# Patient Record
Sex: Female | Born: 1975 | Race: Black or African American | Hispanic: No | Marital: Married | Smoking: Current every day smoker
Health system: Southern US, Community
[De-identification: ages and names within clinical notes are randomized; demographics above are authoritative.]

---

## 2004-08-30 ENCOUNTER — Observation Stay: Payer: Self-pay

## 2004-09-16 DIAGNOSIS — O4290 Premature rupture of membranes, unspecified as to length of time between rupture and onset of labor, unspecified weeks of gestation: Secondary | ICD-10-CM

## 2006-04-10 ENCOUNTER — Emergency Department: Payer: Self-pay | Admitting: Emergency Medicine

## 2010-04-21 ENCOUNTER — Inpatient Hospital Stay: Payer: Self-pay | Admitting: Internal Medicine

## 2011-05-13 IMAGING — CR DG CHEST 2V
1 series · 3 of 3 positions shown · non-contrast
Comparison: none

REASON FOR EXAM: r side rib pain
COMMENTS:   May transport without cardiac monitor

[Series 1: view not recorded · 0.17mm/px · 3 of 3 slices shown]
[im 1/3]
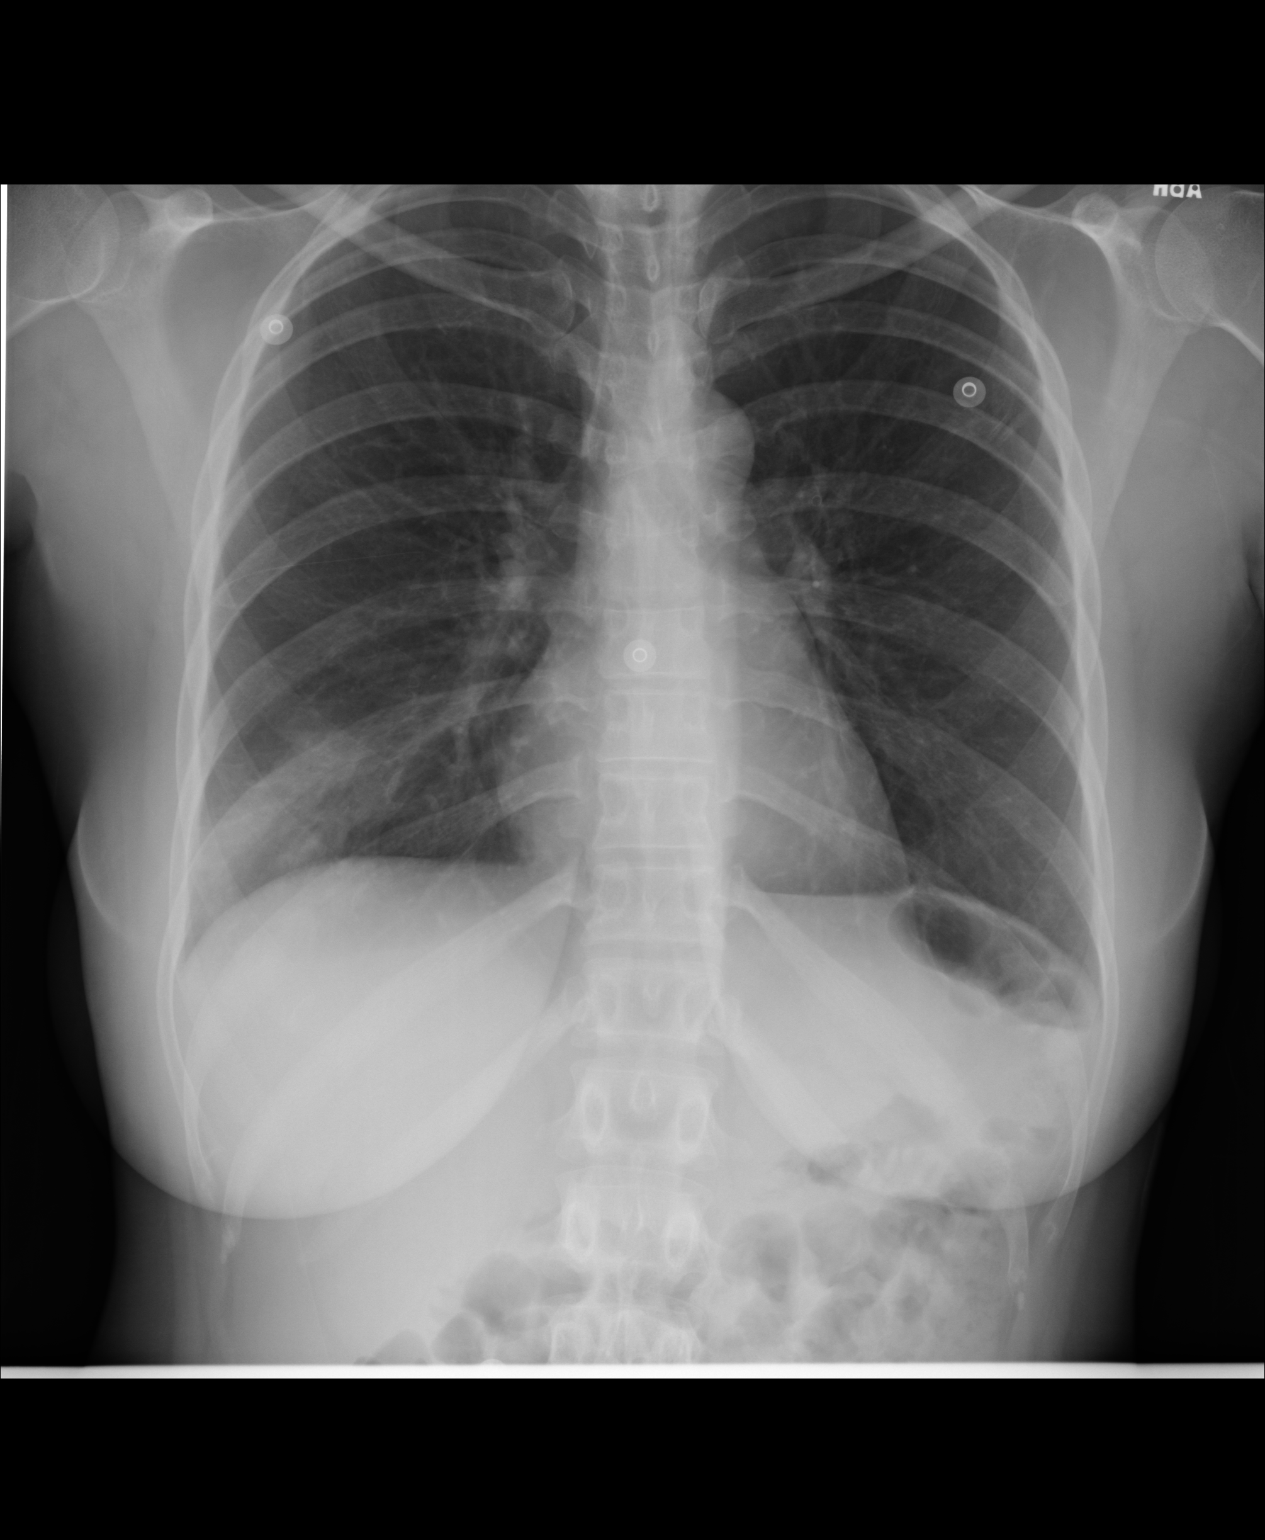
[im 2/3]
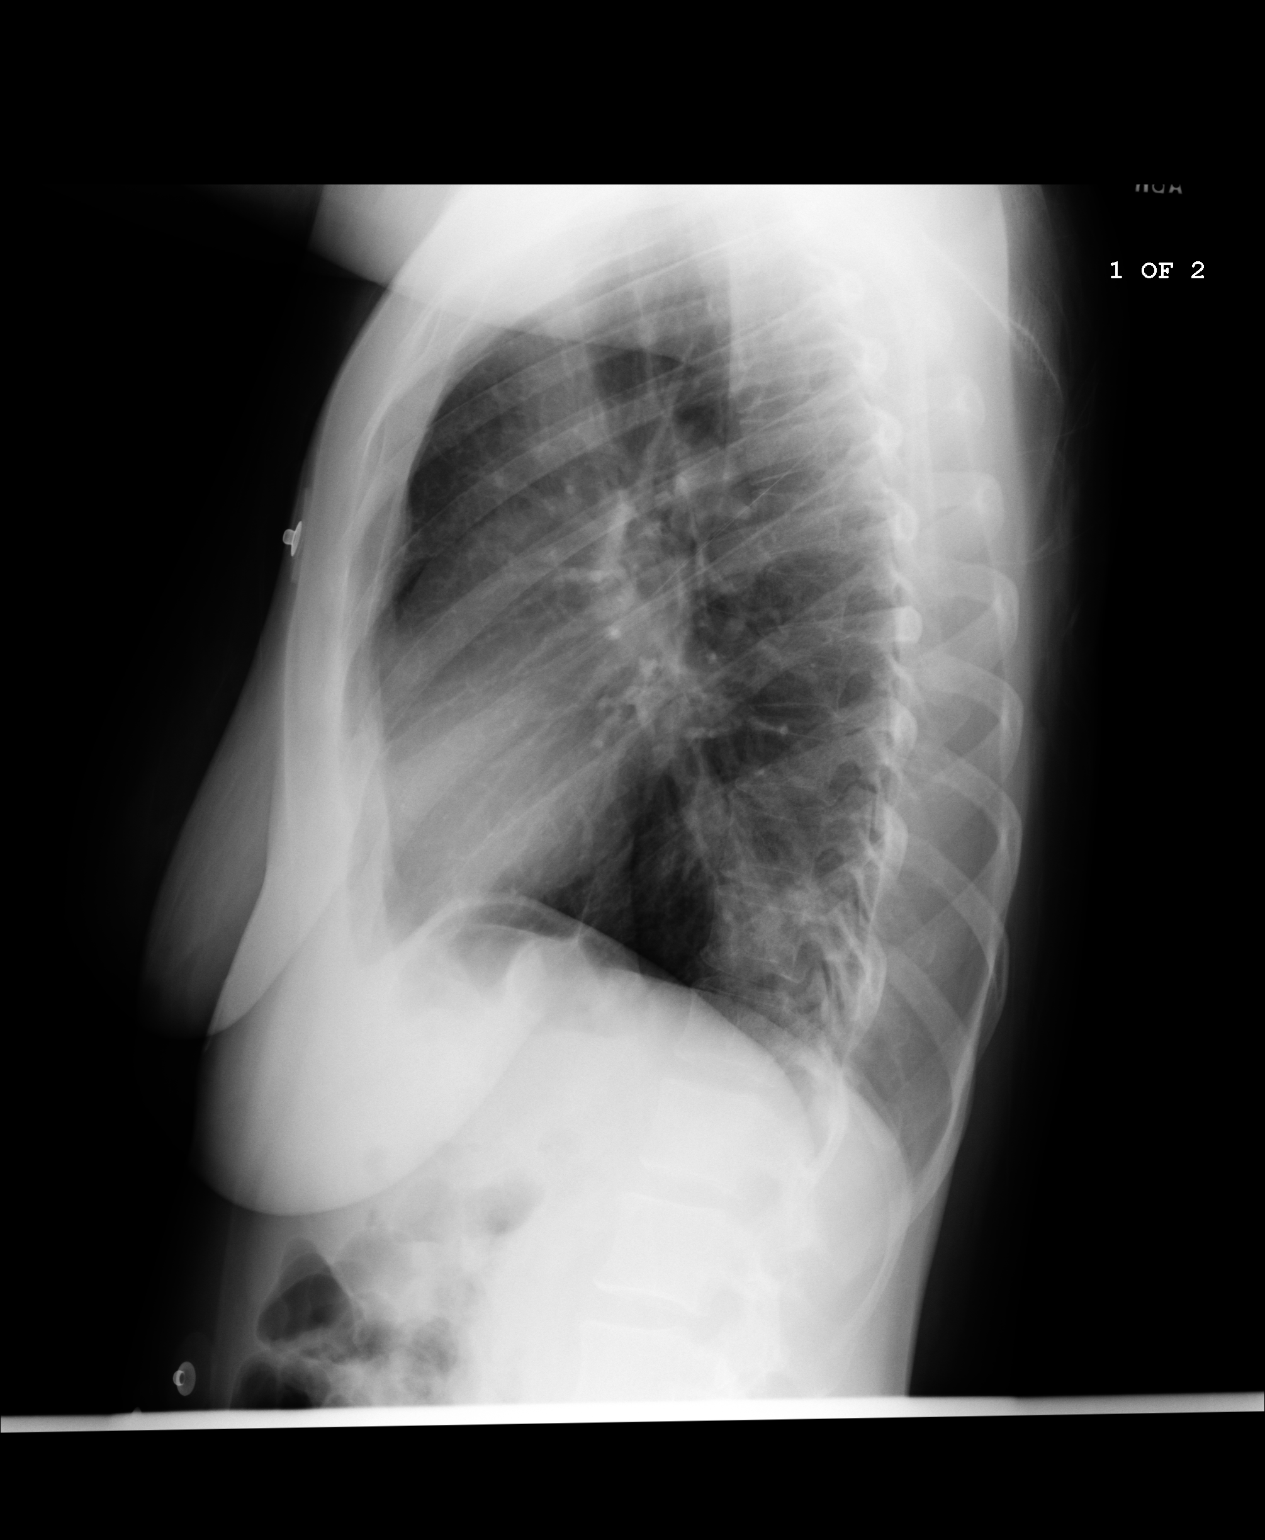
[im 3/3]
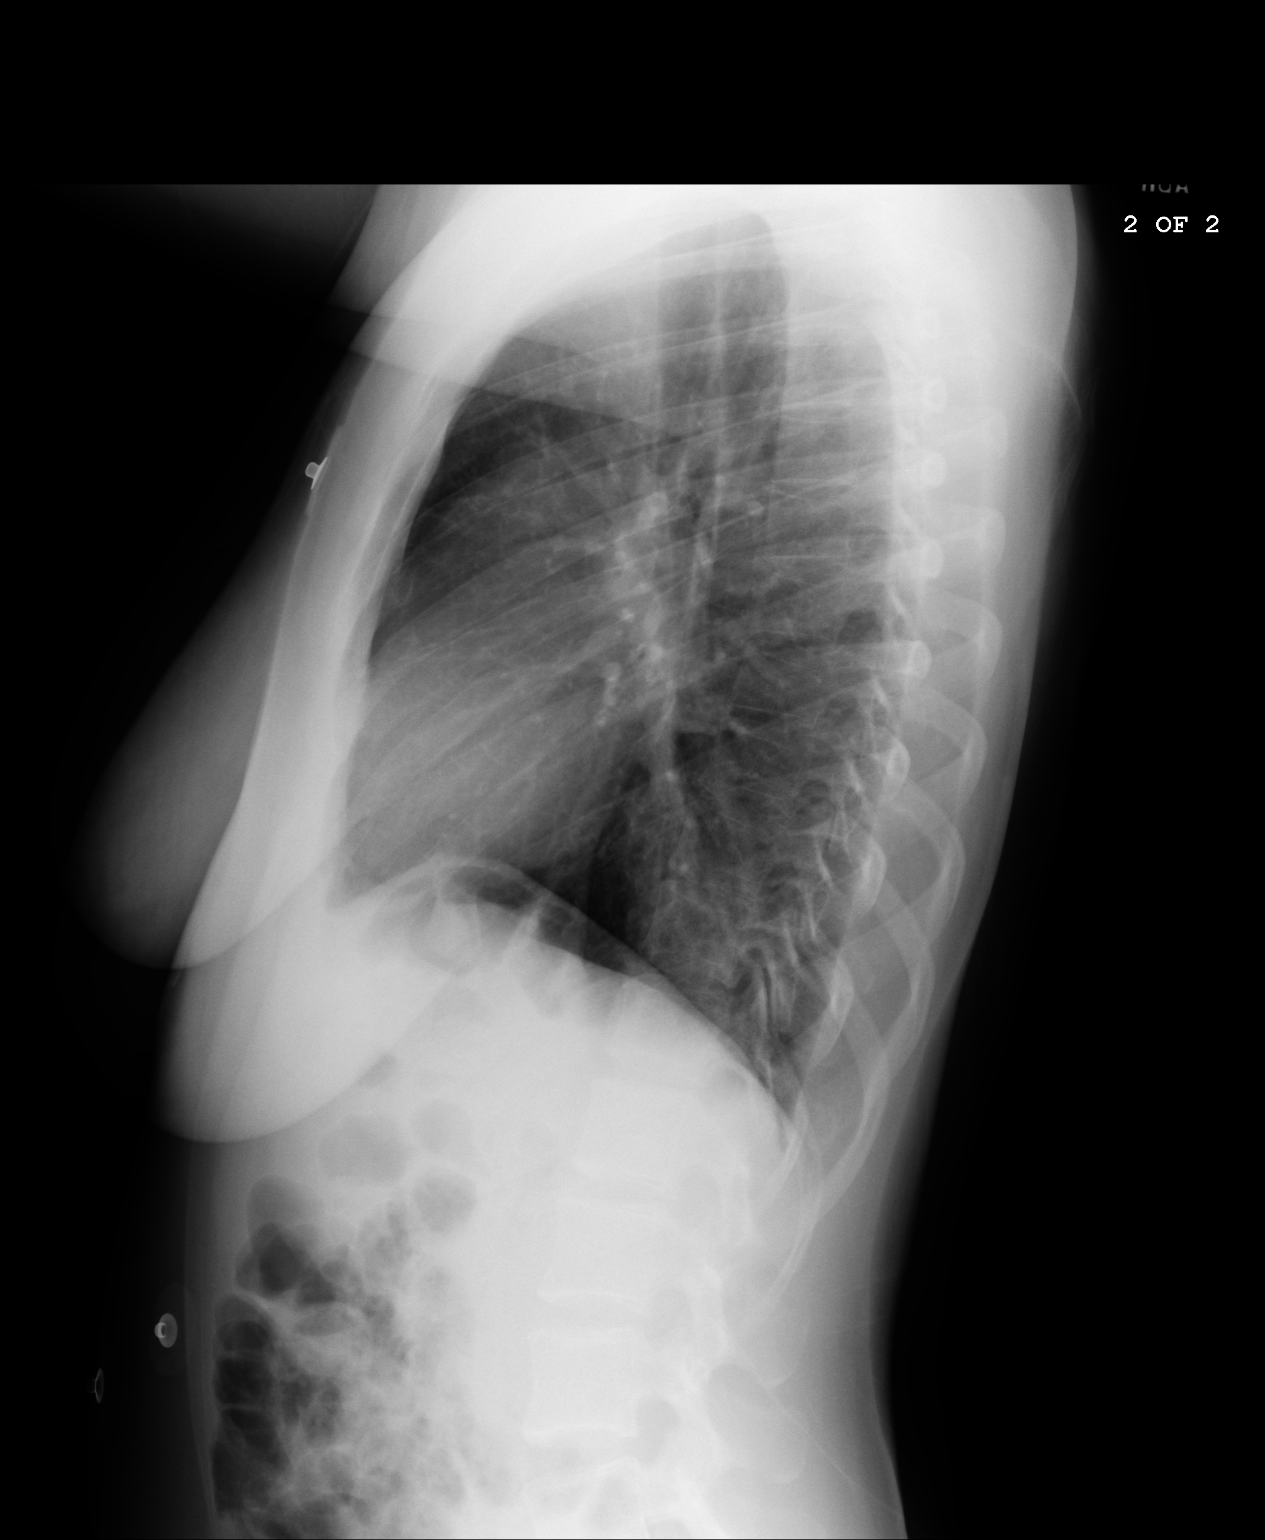

[3 of 3 positions shown; findings below may reference images not displayed]

PROCEDURE:     DXR - DXR CHEST PA (OR AP) AND LATERAL  - April 21, 2010  [DATE]

RESULT:     An area of increased density projects within the posterolateral
segment of the right lower lobe. No further focal regions of consolidation
or focal infiltrates appreciated. The cardiac silhouette and visualized bony
skeleton are unremarkable.
IMPRESSION: Right lower lobe infiltrate. Surveillance evaluation recommended status post
appropriate therapeutic regimen.

## 2011-09-09 ENCOUNTER — Encounter: Payer: Self-pay | Admitting: Obstetrics & Gynecology

## 2011-09-23 ENCOUNTER — Encounter: Payer: Self-pay | Admitting: Maternal and Fetal Medicine

## 2011-10-07 ENCOUNTER — Encounter: Payer: Self-pay | Admitting: Obstetrics and Gynecology

## 2011-10-07 ENCOUNTER — Observation Stay: Payer: Self-pay | Admitting: Obstetrics and Gynecology

## 2011-10-14 ENCOUNTER — Encounter: Payer: Self-pay | Admitting: Obstetrics & Gynecology

## 2011-10-22 ENCOUNTER — Encounter: Payer: Self-pay | Admitting: Pediatric Cardiology

## 2011-10-31 ENCOUNTER — Encounter: Payer: Self-pay | Admitting: Obstetrics & Gynecology

## 2011-11-18 ENCOUNTER — Encounter: Payer: Self-pay | Admitting: Maternal & Fetal Medicine

## 2011-12-05 ENCOUNTER — Encounter: Payer: Self-pay | Admitting: Obstetrics and Gynecology

## 2011-12-19 ENCOUNTER — Inpatient Hospital Stay: Payer: Self-pay | Admitting: Obstetrics and Gynecology

## 2011-12-19 LAB — CBC WITH DIFFERENTIAL/PLATELET
Eosinophil %: 0.6 %
HCT: 26.6 % — ABNORMAL LOW (ref 35.0–47.0)
Lymphocyte #: 1.6 10*3/uL (ref 1.0–3.6)
Lymphocyte %: 25.9 %
MCV: 89 fL (ref 80–100)
Monocyte %: 18.6 %
Neutrophil #: 3.4 10*3/uL (ref 1.4–6.5)
Neutrophil %: 54.8 %
Platelet: 235 10*3/uL (ref 150–440)
RBC: 3.01 10*6/uL — ABNORMAL LOW (ref 3.80–5.20)
RDW: 14.6 % — ABNORMAL HIGH (ref 11.5–14.5)
WBC: 6.3 10*3/uL (ref 3.6–11.0)

## 2011-12-19 LAB — DRUG SCREEN, URINE
Barbiturates, Ur Screen: NEGATIVE (ref ?–200)
Benzodiazepine, Ur Scrn: NEGATIVE (ref ?–200)
Cannabinoid 50 Ng, Ur ~~LOC~~: NEGATIVE (ref ?–50)
Cocaine Metabolite,Ur ~~LOC~~: NEGATIVE (ref ?–300)
MDMA (Ecstasy)Ur Screen: NEGATIVE (ref ?–500)
Phencyclidine (PCP) Ur S: NEGATIVE (ref ?–25)

## 2011-12-22 LAB — CBC WITH DIFFERENTIAL/PLATELET
Basophil %: 0.3 %
Eosinophil #: 0 10*3/uL (ref 0.0–0.7)
Eosinophil %: 0.1 %
HGB: 7.8 g/dL — ABNORMAL LOW (ref 12.0–16.0)
Lymphocyte %: 25.2 %
Neutrophil %: 56 %
RBC: 2.63 10*6/uL — ABNORMAL LOW (ref 3.80–5.20)

## 2011-12-25 LAB — CBC WITH DIFFERENTIAL/PLATELET
Basophil #: 0 10*3/uL (ref 0.0–0.1)
Basophil %: 0 %
Eosinophil #: 0 10*3/uL (ref 0.0–0.7)
HCT: 28.8 % — ABNORMAL LOW (ref 35.0–47.0)
HGB: 9.5 g/dL — ABNORMAL LOW (ref 12.0–16.0)
Lymphocyte %: 13.6 %
MCHC: 32.9 g/dL (ref 32.0–36.0)
Monocyte %: 13.9 %
Neutrophil #: 9.1 10*3/uL — ABNORMAL HIGH (ref 1.4–6.5)
Neutrophil %: 72.2 %
Platelet: 268 10*3/uL (ref 150–440)
RDW: 14.7 % — ABNORMAL HIGH (ref 11.5–14.5)
WBC: 12.6 10*3/uL — ABNORMAL HIGH (ref 3.6–11.0)

## 2011-12-26 DIAGNOSIS — O4290 Premature rupture of membranes, unspecified as to length of time between rupture and onset of labor, unspecified weeks of gestation: Secondary | ICD-10-CM

## 2011-12-26 DIAGNOSIS — Z9889 Other specified postprocedural states: Secondary | ICD-10-CM

## 2011-12-26 LAB — HEMATOCRIT: HCT: 27.4 % — ABNORMAL LOW (ref 35.0–47.0)

## 2011-12-27 LAB — PATHOLOGY REPORT

## 2012-12-09 IMAGING — US US OB FOLLOW-UP - NRPT [facility]
1 series · 14 of 28 positions shown · non-contrast
Comparison: none

[Series 1: us ob follow-up - nrpt [facility] · 14 of 89 slices shown]
[im 4/89]
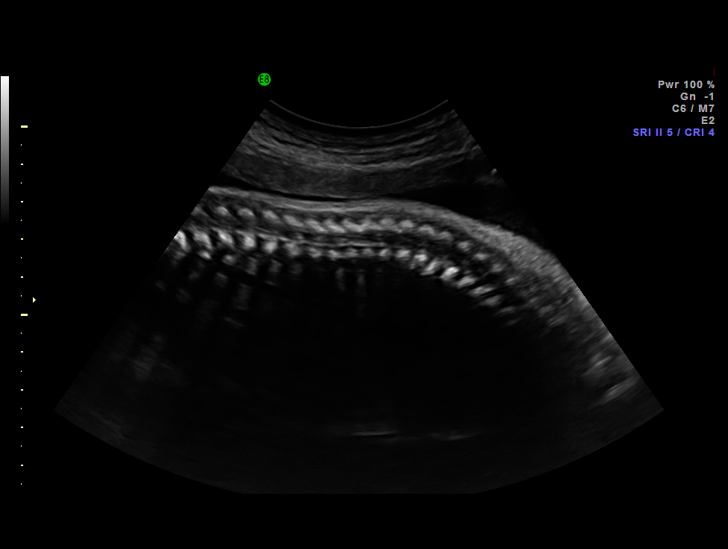
[im 10/89]
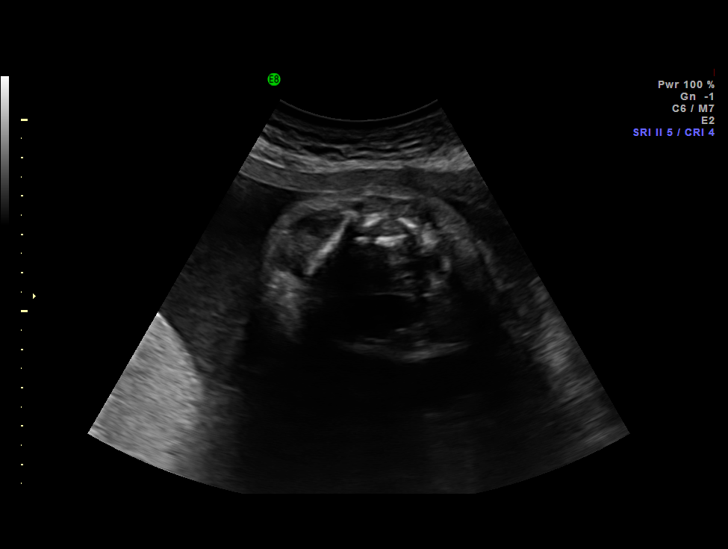
[im 17/89]
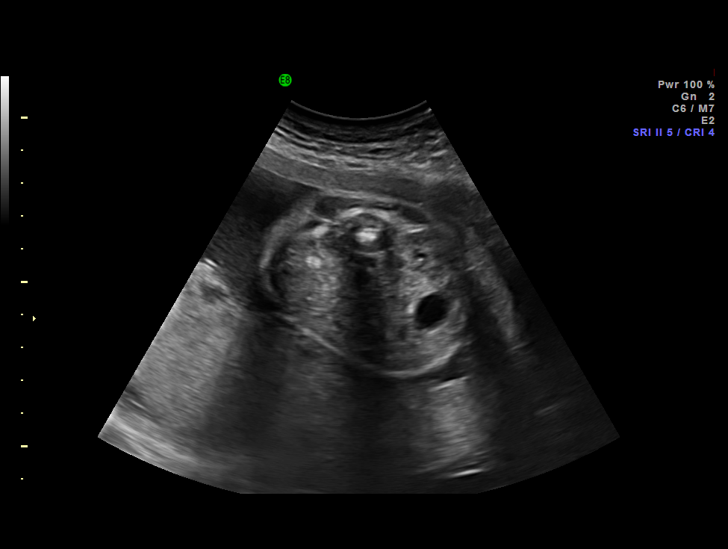
[im 23/89]
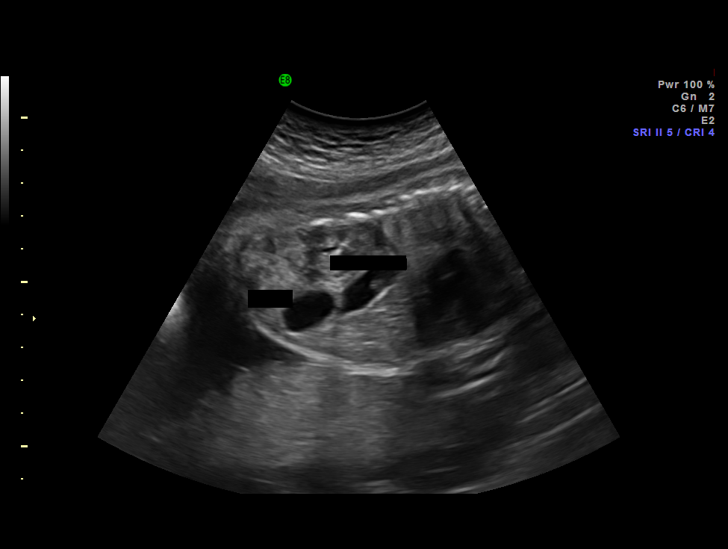
[im 30/89]
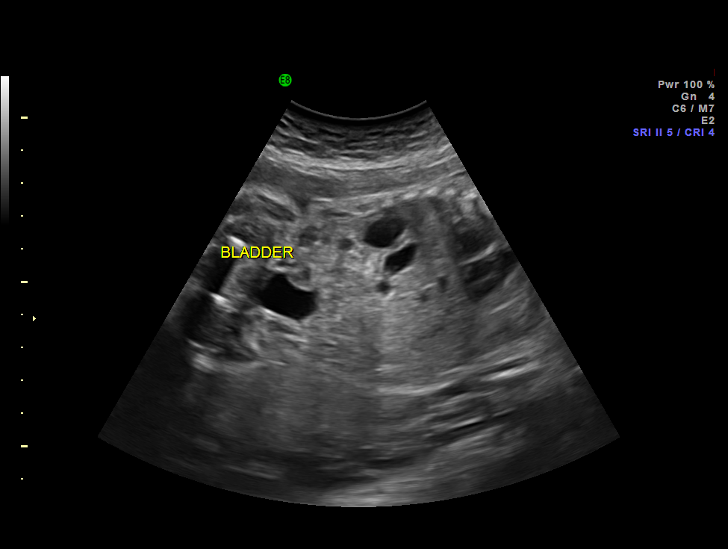
[im 36/89]
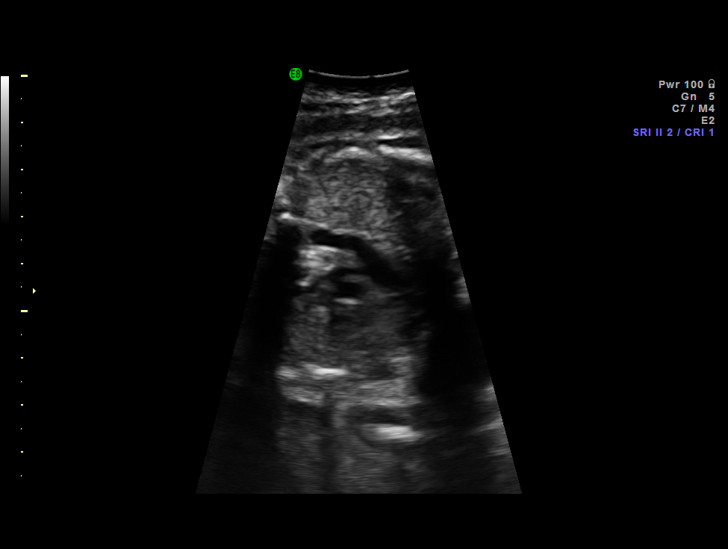
[im 43/89]
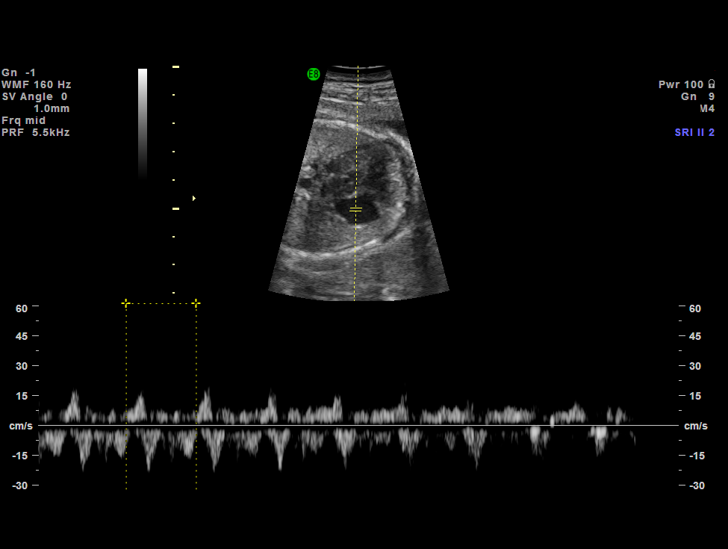
[im 49/89]
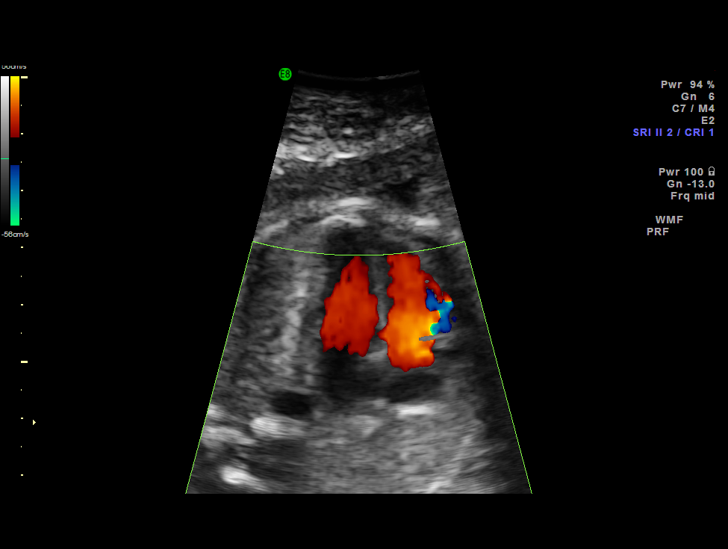
[im 56/89]
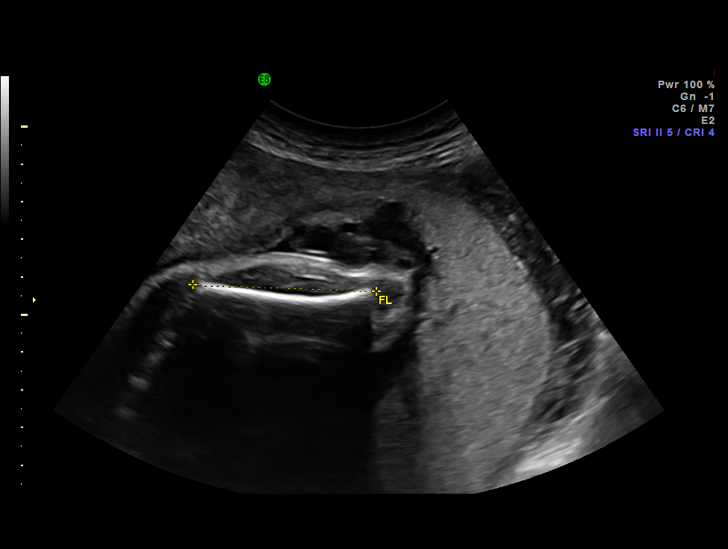
[im 62/89]
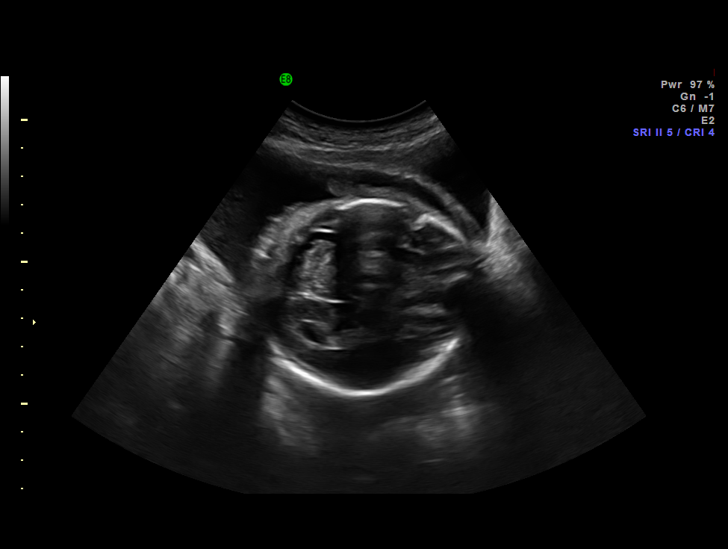
[im 69/89]
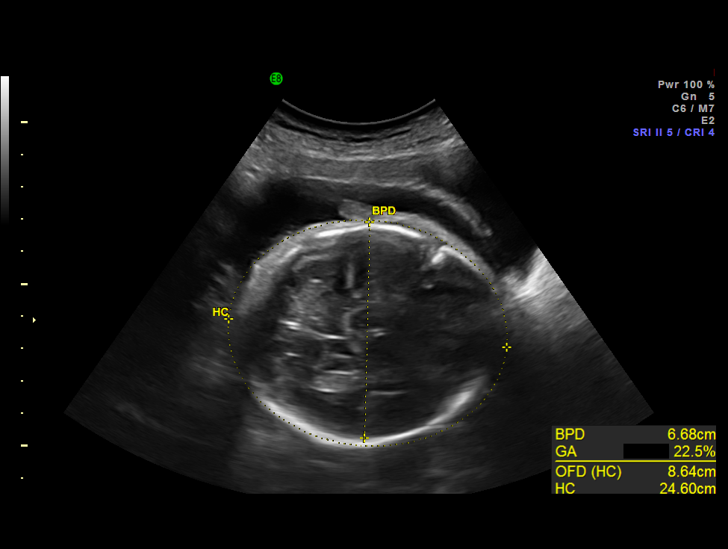
[im 75/89]
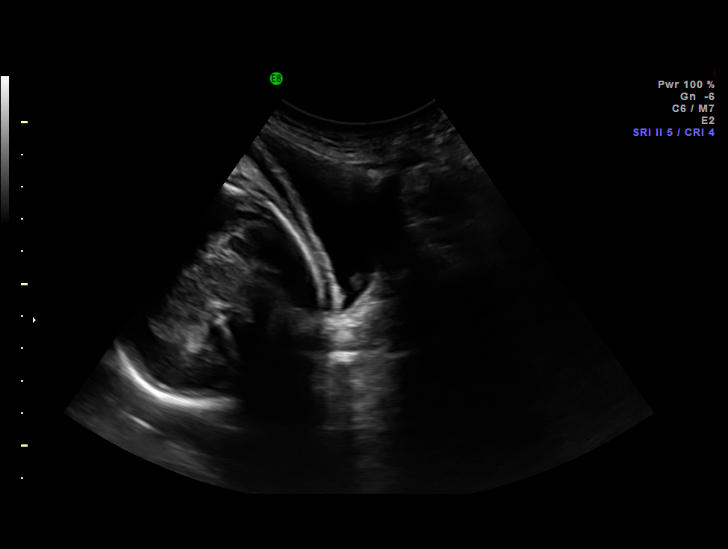
[im 82/89]
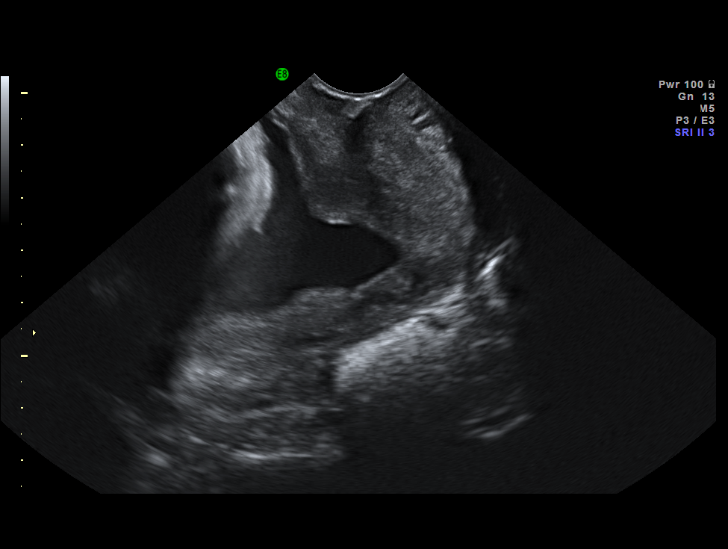
[im 89/89]
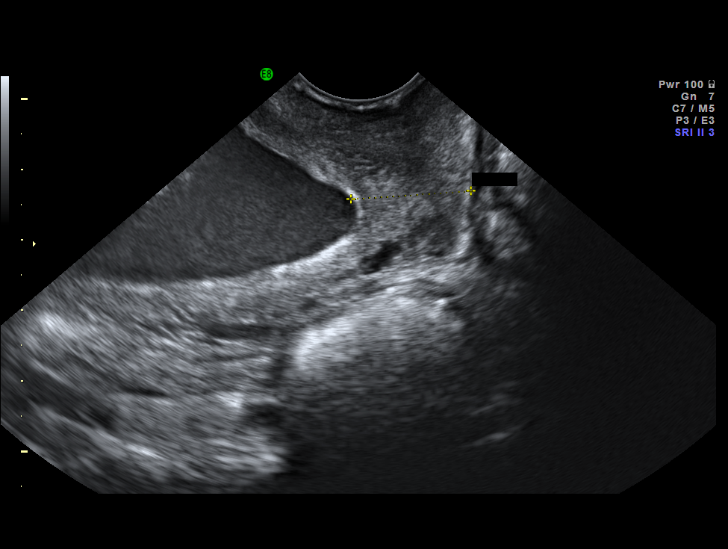

[14 of 28 positions shown; findings below may reference images not displayed]

IMAGES IMPORTED FROM THE [employer] WORKFLOW SYSTEM
NO DICTATION FOR STUDY

## 2013-01-13 IMAGING — US US OB FOLLOW-UP
1 series · 13 of 28 positions shown · non-contrast
Comparison: none

REASON FOR EXAM: 32w 4d, SROM, cerclage, need AFI and growth
COMMENTS:

[Series 1: us ob follow-up · 0.33mm/px · 13 of 94 slices shown]
[im 4/94]
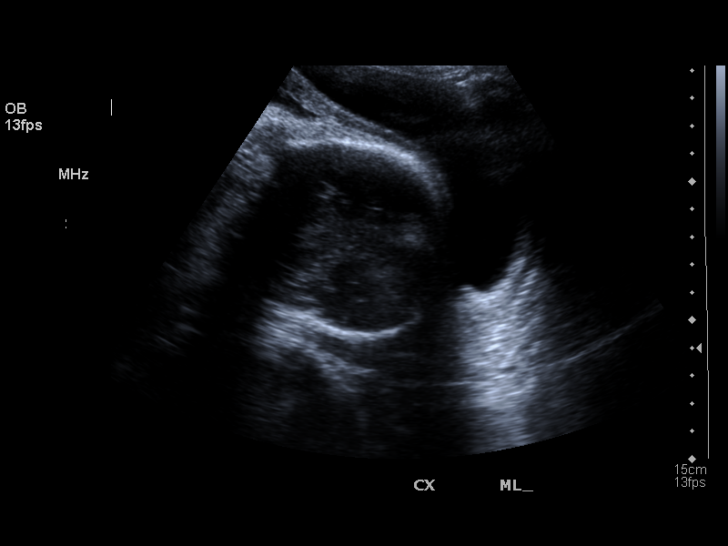
[im 11/94]
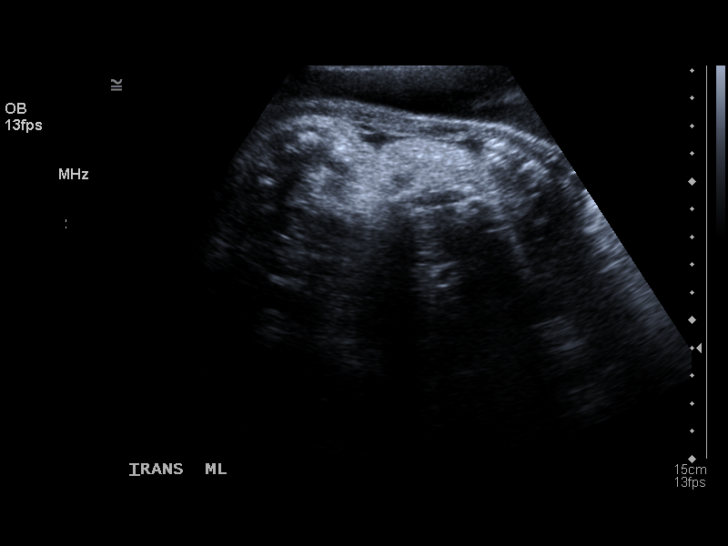
[im 18/94]
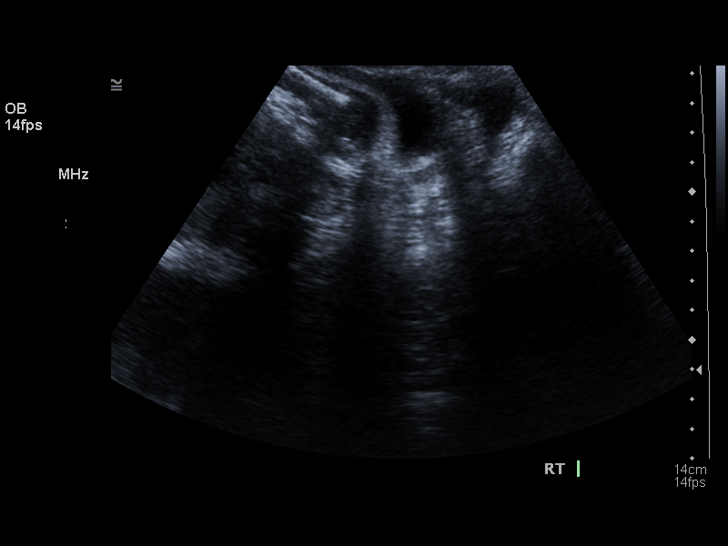
[im 25/94]
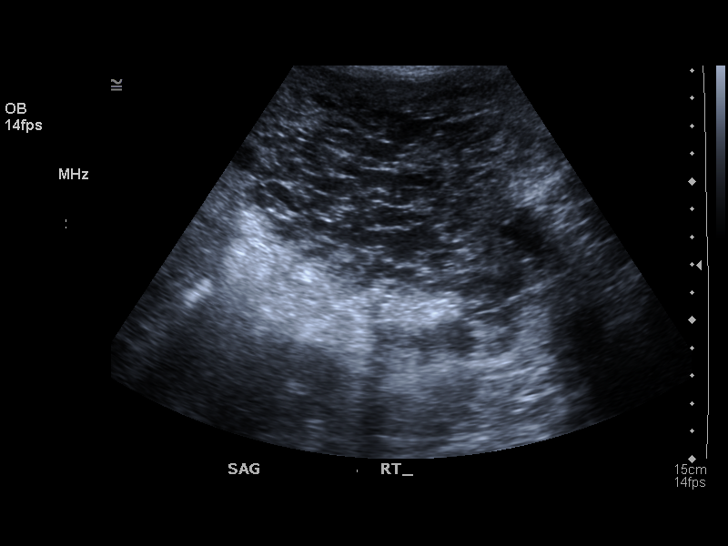
[im 32/94]
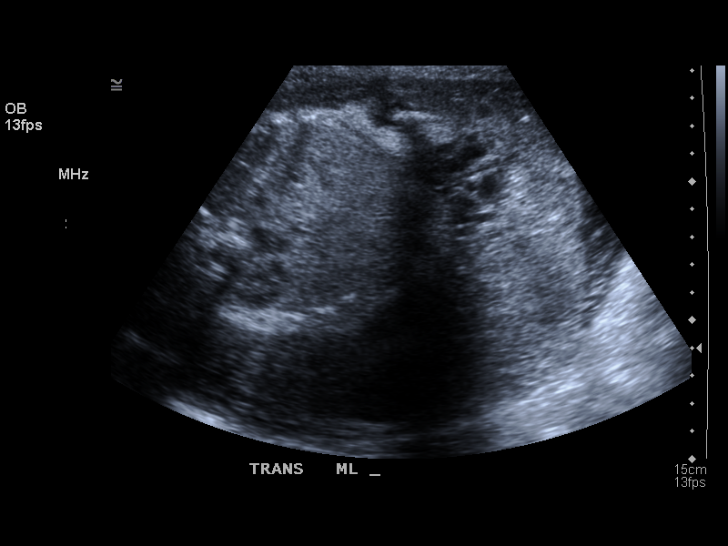
[im 38/94]
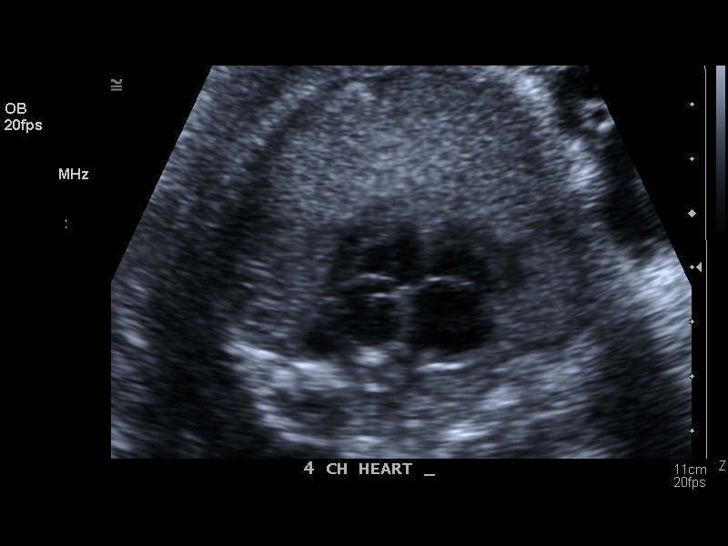
[im 49/94]
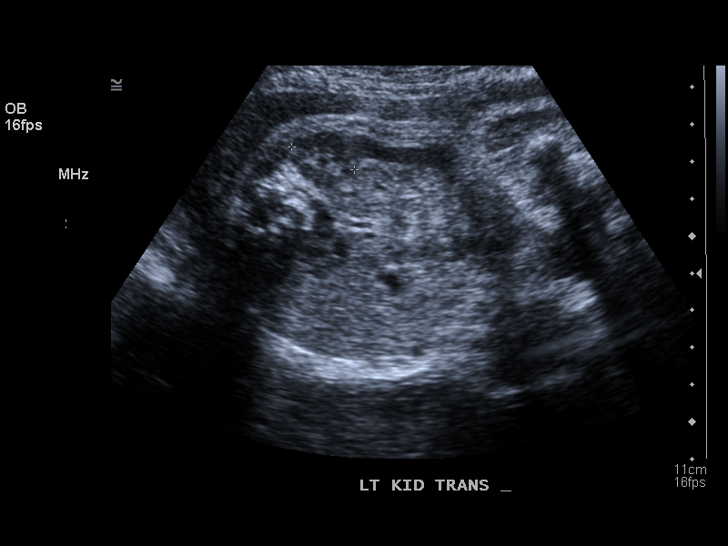
[im 56/94]
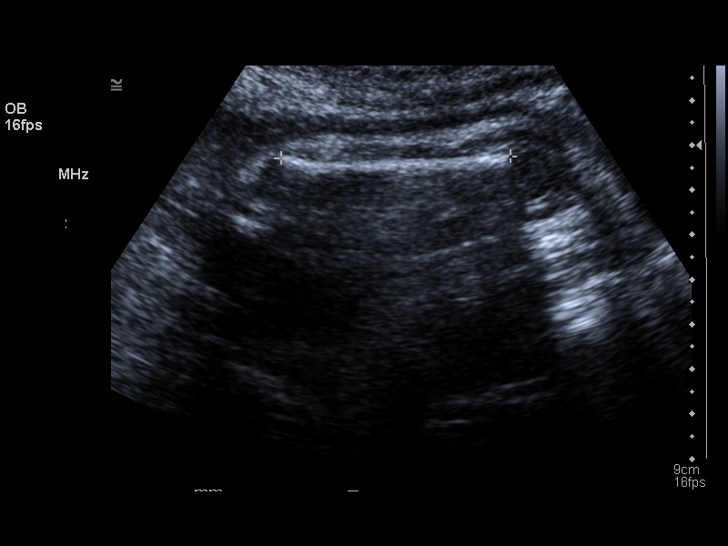
[im 63/94]
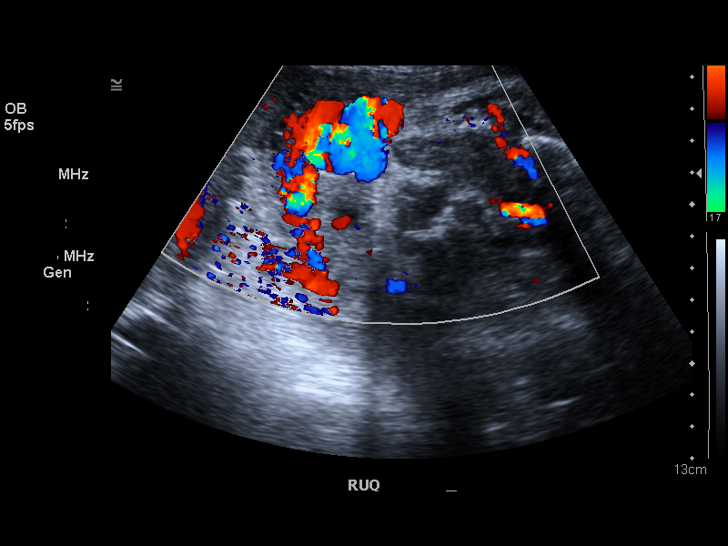
[im 69/94]
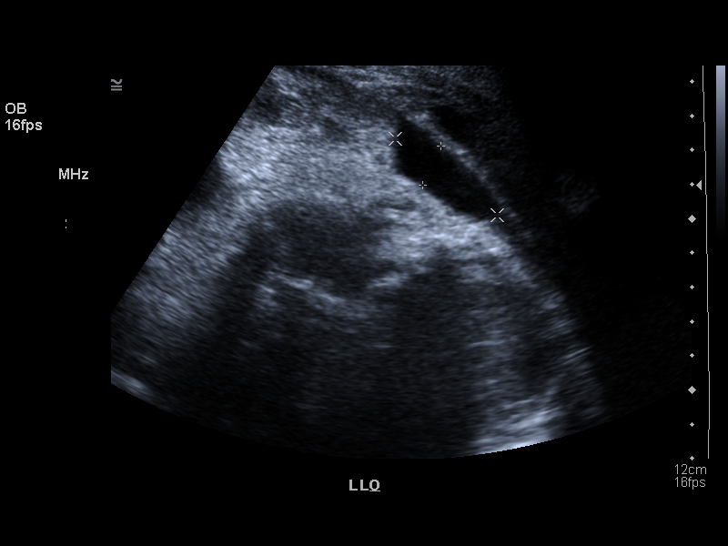
[im 76/94]
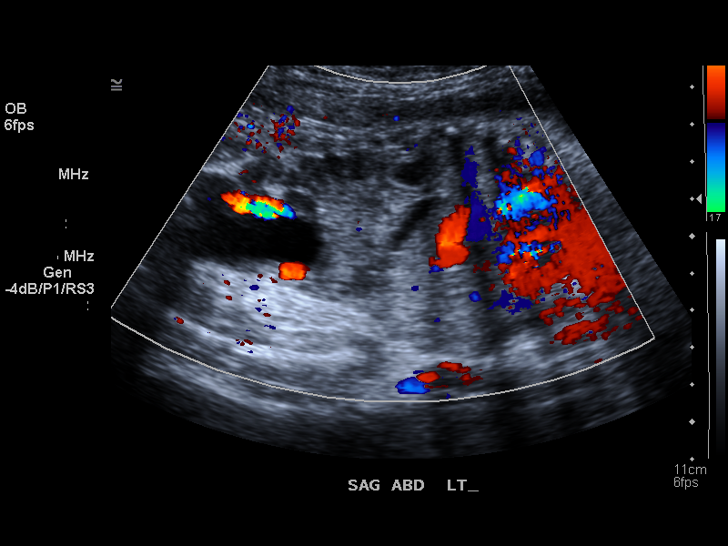
[im 83/94]
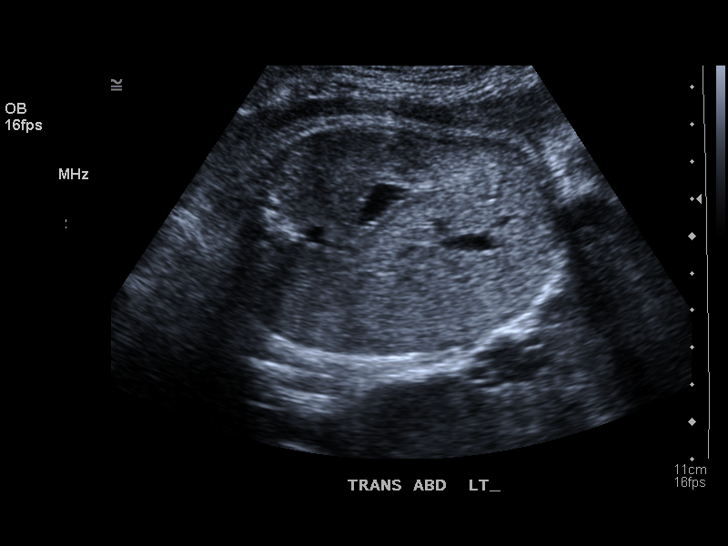
[im 90/94]
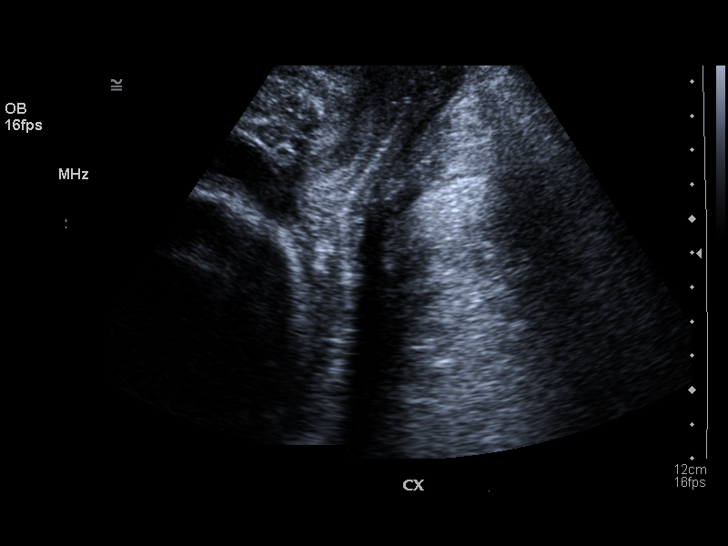

[13 of 28 positions shown; findings below may reference images not displayed]

PROCEDURE:     US  - US OB FOLLOW UP  - December 23, 2011 [DATE]

RESULT:

This patient has been followed in the high risk OB ultrasound clinic.
Limited follow-up examination was performed on this date as requested to
evaluate amnionic fluid level and fetal growth. There is observed a single
living intrauterine gestation. Presentation currently is cephalic. Fetal
heart rate was monitored at 136 beats per minute. The patient reportedly has
a cerclage. No fluid is identified in the cervix on the current exam. The
placenta is posterior in location. The inferior tip of the placenta is
approximately 10.97 cm above the cervix. Amnionic fluid volume appears
decreased. AFI measures 4 cm.

Fetal measurements are as follows:

BPD is 71 mm, corresponding to 28 weeks, 4 days
HC is 276.2 mm, corresponding to 30 weeks, 1 day
AC is 256.9 mm, corresponding to 29 weeks, 6 days
FL is 56.7 mm, corresponding to 29 weeks 5 days
HL is 51.3 mm, corresponding to 30 weeks, 0 days

EFW is 1,449 grams + / - 219 grams. Average ultrasound age based on today's
measurements is 29 weeks, 5 days and ultrasound EDD is 03/04/2012. On the
prior exam at the high risk clinic, gestational age is estimated to be 29
weeks, 6 days. As noted on the current exam, estimated gestational age is 29
weeks, 5 days with there having been apparently no appreciable interval
growth since the previous study. It is recommended that in view of the
current oligohydramnios and in view of the lack of apparent interval growth
since the prior exam of nearly 3 weeks ago, the patient return for
additional evaluation at the high risk OB clinic.
IMPRESSION: Please see above.

## 2015-02-26 NOTE — Consult Note (Signed)
    Maternal Age 39    Gravida 5    Para 3    Term 2    PreTerm 1    Abortion 1    Living 3    EDC 14-Feb-2012    Gestational Age (wks, days) 31.6    Gestation Single    Maternal Blood Type B    Maternal Rh Positive    Maternal HIV Negative    Maternal Syphilis Ab Nonreactive    Maternal Rubella Immune    Maternal HBsAg Unknown    Maternal GBS Unknown    Prenatal Care Adequate, Late    Family/Social History SocHx: EtOH, Tobacco, and THC use during pregnancy.  Urine toxicology screen was negative at time of admission.     Additional Comments Requested to consult by Dr. Jean RosenthalJackson on this 39 year old B+, serology reassuring mother with history of tobacco, alcohol, and THC use during pregnancy, who is now 631 6/[redacted] week gestation by 17 week US with EFW of 11%ile (11/18/11) with prenatal concern for abdominal cyst that has remained stable in size and cerclage placed at 21 weeks for cocnerns of cervical incompency.  Mom has been on 17-OHP, but receiving doses irregularly due to transportation issues.  She presented last evening for concerns of gush of fluid at 00:30.  [facility], she was positive for PPROM and PTL.  She has started betamethasone, and double coverage latency antibiotics.  Ob is currently planning expectant management with possible induction if she makes it to 34/35 weeks.  I spent ~1245minutes reviewing chart, discussing care plan with Dr. Jean RosenthalJackson and nursing, and consulting with mother.  I informed mom of like morbidity/mortality of an infant born at 32 weeks with EFW of 11% who has been exposed to EtOH, tobacco, and THC.  Discussed potential concerns regarding abdominal cyst,a nd how we will evaluate it once her daughter is born.  Mom expressed understanding.  I answered her questions, and she expressed appreciation for the consultation.  As mom was just able to get her power turned back on, her boyfriend (FOB) requires dialysis, she has a 7213 and 37 year old at  home, and is hopefully going to remain on inpateint bedrest for 2-3 weeks, I offered social work consultation.  She declined it at this time.  Thank you for allowing us to particiapte in her care, and please let us know if there are any additional questions or concerns.  We will expect to attend her delivery.   Electronic Signatures: Torrie MayersHorowitz, Thomasena Vandenheuvel N (MD)  (Signed [phone]-Feb-13 13:24)  Authored: PREGNANCY and LABOR, ADDITIONAL COMMENTS   Last Updated: 14-Feb-13 13:24 by Torrie MayersHorowitz, Charly Hunton N (MD)

## 2015-02-26 NOTE — Op Note (Signed)
PATIENT NAME:  Emily Morrow, Emily Morrow MR#:  [id number] DATE OF BIRTH:  28-Sep-1976  DATE OF PROCEDURE:  12/25/2011  PREOPERATIVE DIAGNOSES:  1. Preterm premature rupture of membranes at 31 weeks, 6 days, currently day seven of rupture at a gestational age of [redacted] weeks, 5 days. 2. Fetal abdominal cyst.  3. Intrauterine growth restriction.  4. Retained McDonald cerclage.   POSTOPERATIVE DIAGNOSES: 1. Preterm premature rupture of membranes at 31 weeks, 6 days, currently day seven of rupture at a gestational age of [redacted] weeks, 5 days. 2. Fetal abdominal cyst.  3. Intrauterine growth restriction.  4. Retained McDonald cerclage.   PROCEDURES PERFORMED:  1. Primary low transverse Cesarean section. 2. Bilateral tubal ligation via Pomeroy method.   ANESTHESIA: Spinal epidural.   ANTIBIOTICS: 2 grams of Ancef.   PRIMARY SURGEON: Florina Ou. Bonney Aid, MD    ASSISTANT: Farrel Conners, CNM    ESTIMATED BLOOD LOSS: 600 mL.   DRAINS OR TUBES: Foley to gravity drainage.   IMPLANTS: None.   PROCEDURE FINDINGS: Emily Morrow female infant weighing 1590 grams, 3 pounds, 8 ounces, Apgars 8 and 9. The infant's name is Emily Morrow.    COMPLICATIONS: None.   PATIENT CONDITION FOLLOWING THE PROCEDURE: Stable.   SPECIMENS REMOVED: Bilateral portions of fallopian tube.   PROCEDURE IN DETAIL: Risks, benefits, and alternatives of the procedure were discussed with the patient in detail prior to proceeding to the OR. The patient's McDonald cerclage which consisted of a single stitch of Mersilene tape was removed. However, the cerclage came out in two pieces and the cervix failed to dilate after removal. There was concern for possible retained portion of cerclage which was unable to be removed and given the risk of continued labor in the presence of retained cerclage, decision was made with the patient to proceed with delivery via primary low transverse Cesarean section. The patient had an epidural in place. She was taken to  the operating room where a spinal was administered. She was positioned in the supine position and prepped and draped in the usual sterile fashion. A Pfannenstiel skin incision was made 2 cm above the pubic symphysis and carried down sharply to the level of the rectus fascia using a knife. The rectus fascia was incised and then extended using Mayo scissors. The superior border of the rectus fascia was grasped with two Kocher clamps. The underlying rectus was dissected off bluntly and attention was then turned to the inferior border of the rectus fascia. It was likewise bluntly dissected off the rectus muscles and the median raphe was incised using Mayo scissors. The peritoneum was then entered bluntly and extended using manual traction. A bladder blade was placed. Bladder flap was then made using Metzenbaum scissors and developed digitally before replacing the bladder blade. The uterus was scored with the knife and the lower uterine segment was noted to be quite thin. The hysterotomy incision was extended using the operator's finger. The infant was noted to be in the OP position. The infant's vertex was grasped, flexed, brought to the hysterotomy incision and delivered atraumatically using fundal pressure. Infant was suctioned, cord was clamped and cut, and the infant was passed to the waiting NICU team. Cord blood was obtained. Cord gas was not obtained given that the infant was vigorous and crying at the time of delivery. The placenta was removed using manual extraction. The uterus was exteriorized and wiped clean of clots and debris. The edges of the hysterotomy incision were tagged using Allis clamps. The hysterotomy was then  closed in two layers using 0 Vicryl with the first layer being a running locked stitch, the second layer being a horizontal imbricating layer. Following closure of the hysterotomy incision, it was inspected and noted to be hemostatic.   Attention was then turned to the patient's right  fallopian tube which was grasped with a Babcock clamp. It was doubly suture ligated using a 2-0 chromic on a reel. The knuckle of tube was then excised using Metzenbaum scissors noting a good cross section of tube. Attention was then turned to the patient's left fallopian tube. It was excised in a similar fashion and both fallopian tubes were noted to be hemostatic after excision.   The pelvis was then irrigated using warm saline. The uterus was returned to the abdomen and the gutters were wiped clean of clots and debris. Peritoneum was closed using a running 2-0 Monocryl. The rectus muscles were inspected and noted to be hemostatic. At this time the On-Q system was placed. The trocars were inserted approximately 2 cm above the Pfannenstiel skin incision and inserted through the fascia. Once the trocars were placed, they were removed and the On-Q catheters were threaded through the introducers which were removed. Following this, the rectus fascia was closed using a running #1 loop PDS. The subcutaneous tissue was irrigated and hemostasis achieved using the Bovie. Skin was closed in a running subcuticular fashion using 4-0 Monocryl. Following closure of the skin, benzoin was applied and Steri-Strips were applied across the Pfannenstiel skin incision. The base of the On-Q catheters were glued in placed using Dermabond. The catheters were then secured using Steri-Strips and dressed with tegaderm. Following this, each catheter was primed using 5 mL of 0.5% bupivacaine. The patient tolerated the procedure well. Sponge, needle, and instrument counts were correct x2.   ____________________________ Florina OuAndreas M. Bonney AidStaebler, MD ams:drc D: 12/26/2011 12:37:08 ET T: 12/26/2011 12:48:18 ET JOB#: [id number]  cc: Florina OuAndreas M. Bonney AidStaebler, MD, <Dictator> Carmel SacramentoANDREAS Cathrine MusterM Delitha Elms MD ELECTRONICALLY SIGNED 01/19/2012 14:31

## 2015-03-14 NOTE — H&P (Signed)
L&D Evaluation:  History Expanded:   HPI 39 yo [id number] with EDD of 02/14/12 per 17 week US. [facility], history notable for h/o LEEP, cervix of 5-8 mm and McDonald cerclage placed by [facility] at 21 weeks, weekly 17 P injections for h/o PTD at 32 weeks, frequent ETOH use during pregnancy (pt declined [facility] referral), tobacco and marijuana use, fetal abdominal cyst that was rechecked by [facility] without change in size and anemia. Most recent EFW was 11% on 1/14, cervical length was 1.1-1.3 cm on 1/31. Fetal echo WNL, ordered for family h/o congenital heart disease (report not available).  Pt presents tonight with c/o gush of fluid at 0030, denies regular contractions or vaginal bleeding, good fetal movement.    Blood Type B positive    Group B Strep Results (Result >5wks must be treated as unknown) unknown/result > 5 weeks ago    Maternal HIV Negative    Maternal Syphilis Ab Nonreactive    Maternal Varicella Non-Immune    Rubella Results immune    Maternal T-Dap Nonimmune    Patient's Medical History No Chronic Illness    Patient's Surgical History LEEP  Cerclage    Medications Pre Natal Vitamins    Allergies NKDA    Social History tobacco  EtOH  drugs  marijuana   ROS:   ROS see HPI   Exam:   General no apparent distress    Mental Status clear    Abdomen gravid, non-tender    Estimated Fetal Weight Small for gestational age    Fetal Position vertex    Edema no edema    Pelvic no external lesions, closed per cerclage/80/-1    Mebranes Ruptured    Description clear    FHT initially minimal variability, category I after IV bolus    Ucx irregular    Skin dry    Other Nitrizine, pooling and Fern: positive   Impression:   Impression PPROM   Plan:   Comments Dr Patton SallesWeaver-Lee made aware of admission Betamethasone & Ampicillin Monitor for s/sx of labor or other complications.  UDS & Care Management consult   Electronic  Signatures: Vella KohlerBrothers, Johsua Shevlin K (CNM)  (Signed 14-Feb-13 03:28)  Authored: L&D Evaluation   Last Updated: 14-Feb-13 03:28 by Vella KohlerBrothers, Venia Riveron K (CNM)

## 2018-01-09 ENCOUNTER — Emergency Department: Payer: Medicaid Other

## 2018-01-09 ENCOUNTER — Other Ambulatory Visit: Payer: Self-pay

## 2018-01-09 ENCOUNTER — Emergency Department
Admission: EM | Admit: 2018-01-09 | Discharge: 2018-01-09 | Disposition: A | Discharge status: Home / Self Care | Payer: Medicaid Other | Attending: Student in an Organized Health Care Education/Training Program | Admitting: Student in an Organized Health Care Education/Training Program

## 2018-01-09 DIAGNOSIS — F17200 Nicotine dependence, unspecified, uncomplicated: Secondary | ICD-10-CM | POA: Diagnosis not present

## 2018-01-09 DIAGNOSIS — M25552 Pain in left hip: Secondary | ICD-10-CM | POA: Insufficient documentation

## 2018-01-09 DIAGNOSIS — Y998 Other external cause status: Secondary | ICD-10-CM | POA: Diagnosis not present

## 2018-01-09 DIAGNOSIS — Y939 Activity, unspecified: Secondary | ICD-10-CM | POA: Insufficient documentation

## 2018-01-09 DIAGNOSIS — S39012A Strain of muscle, fascia and tendon of lower back, initial encounter: Secondary | ICD-10-CM | POA: Insufficient documentation

## 2018-01-09 DIAGNOSIS — W109XXA Fall (on) (from) unspecified stairs and steps, initial encounter: Secondary | ICD-10-CM | POA: Diagnosis not present

## 2018-01-09 DIAGNOSIS — Y929 Unspecified place or not applicable: Secondary | ICD-10-CM | POA: Diagnosis not present

## 2018-01-09 DIAGNOSIS — W19XXXA Unspecified fall, initial encounter: Secondary | ICD-10-CM

## 2018-01-09 MED ORDER — NAPROXEN 500 MG PO TABS: 500.0000 mg | ORAL_TABLET | Freq: Two times a day (BID) | ORAL | 0 refills | Status: DC

## 2018-01-09 MED ORDER — METHOCARBAMOL 500 MG PO TABS: 500.0000 mg | ORAL_TABLET | Freq: Four times a day (QID) | ORAL | 0 refills | Status: DC | PRN

## 2018-01-09 MED ORDER — HYDROCODONE-ACETAMINOPHEN 5-325 MG PO TABS: 1.0000 | ORAL_TABLET | Freq: Four times a day (QID) | ORAL | 0 refills | Status: DC | PRN

## 2018-01-09 MED ORDER — HYDROCODONE-ACETAMINOPHEN 5-325 MG PO TABS
1.0000 | ORAL_TABLET | Freq: Once | ORAL | Status: AC
  Administered 2018-01-09: 1 via ORAL
  Filled 2018-01-09: qty 1

## 2018-01-09 MED ORDER — METHOCARBAMOL 500 MG PO TABS
750.0000 mg | ORAL_TABLET | Freq: Once | ORAL | Status: AC
  Administered 2018-01-09: 750 mg via ORAL
  Filled 2018-01-09: qty 2

## 2018-01-09 NOTE — ED Triage Notes (Signed)
To ER via POV c/o left hip pain since slip and fall X 3 days ago. Pain worsening over the following days. Painful to stand and walk. Pt in wheelchair.

## 2018-01-09 NOTE — Discharge Instructions (Signed)
Follow-up with [facility] acute care if any continued problems.  Take medication only as directed.  Norco 1 every 6 hours if needed for moderate pain.  Robaxin 500 mg every 6 hours as needed for muscle spasms.  Naproxen 500 mg twice daily with food.  You may also use ice to your hip and back as needed for discomfort.

## 2018-01-09 NOTE — ED Provider Notes (Signed)
[facility] Emergency Department Provider Note  ____________________________________________   First MD Initiated Contact with Patient 01/09/18 1805     (approximate)  I have reviewed the triage vital signs and the nursing notes.   HISTORY  Chief Complaint Hip Pain   HPI Emily Morrow is a 42 y.o. female is here complaint of left hip pain.  Patient states that she fell 3 days ago on some steps.  She states this was a mechanical fall rather than a syncopal episode.  She is continued to have pain with walking and standing.  She has not taken any over-the-counter medication for her injury.  Pain is increased with any range of motion.  Patient rates her pain as a 10/10.  History reviewed. No pertinent past medical history.  There are no active problems to display for this patient.   History reviewed. No pertinent surgical history.  Prior to Admission medications   Medication Sig Start Date End Date Taking? Authorizing Provider  HYDROcodone-acetaminophen (NORCO/VICODIN) 5-325 MG tablet Take 1 tablet by mouth every 6 (six) hours as needed for moderate pain. 01/09/18   Tommi RumpsSummers, Rhonda L, PA-C  methocarbamol (ROBAXIN) 500 MG tablet Take 1 tablet (500 mg total) by mouth every 6 (six) hours as needed for muscle spasms. 01/09/18   Tommi RumpsSummers, Rhonda L, PA-C  naproxen (NAPROSYN) 500 MG tablet Take 1 tablet (500 mg total) by mouth 2 (two) times daily with a meal. 01/09/18   Tommi RumpsSummers, Rhonda L, PA-C    Allergies Patient has no known allergies.  No family history on file.  Social History Social History   Tobacco Use  . Smoking status: Current Every Day Smoker  Substance Use Topics  . Alcohol use: No    Frequency: Never  . Drug use: Not on file    Review of Systems Constitutional: No fever/chills Eyes: No visual changes. ENT: No trauma Cardiovascular: Denies chest pain. Respiratory: Denies shortness of breath. Gastrointestinal: No abdominal pain.  No nausea, no  vomiting.  Musculoskeletal: Negative for back pain.  Positive for left hip pain.  Positive for left lower back pain. Skin: Negative for rash. Neurological: Negative for headaches, focal weakness or numbness. ___________________________________________   PHYSICAL EXAM:  VITAL SIGNS: ED Triage Vitals [01/09/18 1709]  Enc Vitals Group     BP 106/69     Pulse Rate 86     Resp 18     Temp 97.7 F (36.5 C)     Temp Source Oral     SpO2 100 %     Weight 98 lb (44.5 kg)     Height 5' (1.524 m)     Head Circumference      Peak Flow      Pain Score 10     Pain Loc      Pain Edu?      Excl. in GC?    Constitutional: Alert and oriented. Well appearing and in no acute distress. Eyes: Conjunctivae are normal. PERRL. EOMI. Head: Atraumatic. Nose: Mouth/Throat: Mucous membranes are moist.  Oropharynx non-erythematous. Neck: No stridor.  No cervical tenderness on palpation posteriorly.  Range of motion is without restriction. Cardiovascular: Normal rate, regular rhythm. Grossly normal heart sounds.  Good peripheral circulation. Respiratory: Normal respiratory effort.  No retractions. Lungs CTAB.  No point tenderness is elicited on palpation of the ribs. Gastrointestinal: Soft and nontender. No distention.  No CVA tenderness.  Bowel sounds are present x4 quadrants. Musculoskeletal: Examination of the back there is no gross  deformity or soft tissue swelling observed.  There are no ecchymosis or abrasions seen.  Patient is moderately tender on palpation of the L5-S1 vertebral bodies and soft tissue on the left.  Range of motion is restricted secondary to patient's pain.  On examination of the left hip there is no gross deformity no soft tissue injury is seen.  Range of motion is restricted secondary to patient's pain.  No edema is present to the lower extremity.  Gait was not tested secondary to patient's pain tolerance. Neurologic:  Normal speech and language. No gross focal neurologic deficits  are appreciated.  Skin:  Skin is warm, dry and intact.  No ecchymosis or abrasions were seen. Psychiatric: Mood and affect are normal. Speech and behavior are normal.  ____________________________________________   LABS (all labs ordered are listed, but only abnormal results are displayed)  Labs Reviewed - No data to display RADIOLOGY  ED MD interpretation:   Lumbar spine x-ray is negative for fracture.  Left hip x-ray is negative for fracture  Official radiology report(s): Dg Lumbar Spine 2-3 Views  Result Date: 01/09/2018 CLINICAL DATA:  Pt reports that she fell on Monday and has lower back pain. Pt states that the pain is worse on the left side and runs down the leg. No previous injury to the spine. EXAM: LUMBAR SPINE - 2-3 VIEW COMPARISON:  None. FINDINGS: There is no evidence of lumbar spine fracture. Alignment is normal. Intervertebral disc spaces are maintained. IMPRESSION: Negative. Electronically Signed   By: Amie Portland M.D.   On: 01/09/2018 18:59   Dg Hip Unilat With Pelvis 2-3 Views Left  Result Date: 01/09/2018 CLINICAL DATA:  c/o left hip pain since slip and fall X 3 days ago. Pain worsening over the following days. Painful to stand and walk. EXAM: DG HIP (WITH OR WITHOUT PELVIS) 2-3V LEFT COMPARISON:  None. FINDINGS: There is no evidence of hip fracture or dislocation. There is no evidence of arthropathy or other focal bone abnormality. IMPRESSION: Negative. Electronically Signed   By: Amie Portland M.D.   On: 01/09/2018 17:42    ____________________________________________   PROCEDURES  Procedure(s) performed: None  Procedures  Critical Care performed: No  ____________________________________________   INITIAL IMPRESSION / ASSESSMENT AND PLAN / ED COURSE  As part of my medical decision making, I reviewed the following data within the electronic MEDICAL RECORD NUMBER Notes from prior ED visits and [employer] Controlled Substance Database  Patient was made aware that there  was no fracture seen on x-ray.  She was given Norco and Robaxin while in the emergency department prior to her x-rays.  Patient did get relief with these 2 medications.  She states that pain still increases somewhat with range of motion.  She is encouraged to move frequently to avoid stiffness and to apply ice as needed for pain.  Patient was discharged with a prescription for naproxen 500 mg twice daily with food, Robaxin 500 mg 4 times daily for muscle spasms and Norco if needed for pain every 6 hours.  He is to follow-up with [facility] if any continued problems.  ____________________________________________   FINAL CLINICAL IMPRESSION(S) / ED DIAGNOSES  Final diagnoses:  Left hip pain  Lumbar strain, initial encounter  Fall, initial encounter     ED Discharge Orders        Ordered    HYDROcodone-acetaminophen (NORCO/VICODIN) 5-325 MG tablet  Every 6 hours PRN     01/09/18 1911    methocarbamol (ROBAXIN) 500 MG tablet  Every 6 hours PRN     01/09/18 1911    naproxen (NAPROSYN) 500 MG tablet  2 times daily with meals     01/09/18 1913       Note:  This document was prepared using Dragon voice recognition software and may include unintentional dictation errors.    Tommi Rumps, PA-C 01/09/18 2146    Willy Eddy, MD 01/09/18 2256

## 2019-01-31 IMAGING — CR DG HIP (WITH OR WITHOUT PELVIS) 2-3V*L*
1 series · 3 of 3 positions shown · non-contrast
Comparison: None.

CLINICAL DATA: c/o left hip pain since slip and fall X 3 days ago.
Pain worsening over the following days. Painful to stand and walk.

EXAM:
DG HIP (WITH OR WITHOUT PELVIS) 2-3V LEFT

[Series 1: dg hip unilat w or w/o pelvis 2-3 views  · non-contrast · 0.14mm/px · 3 of 3 slices shown]
[im 1/3]
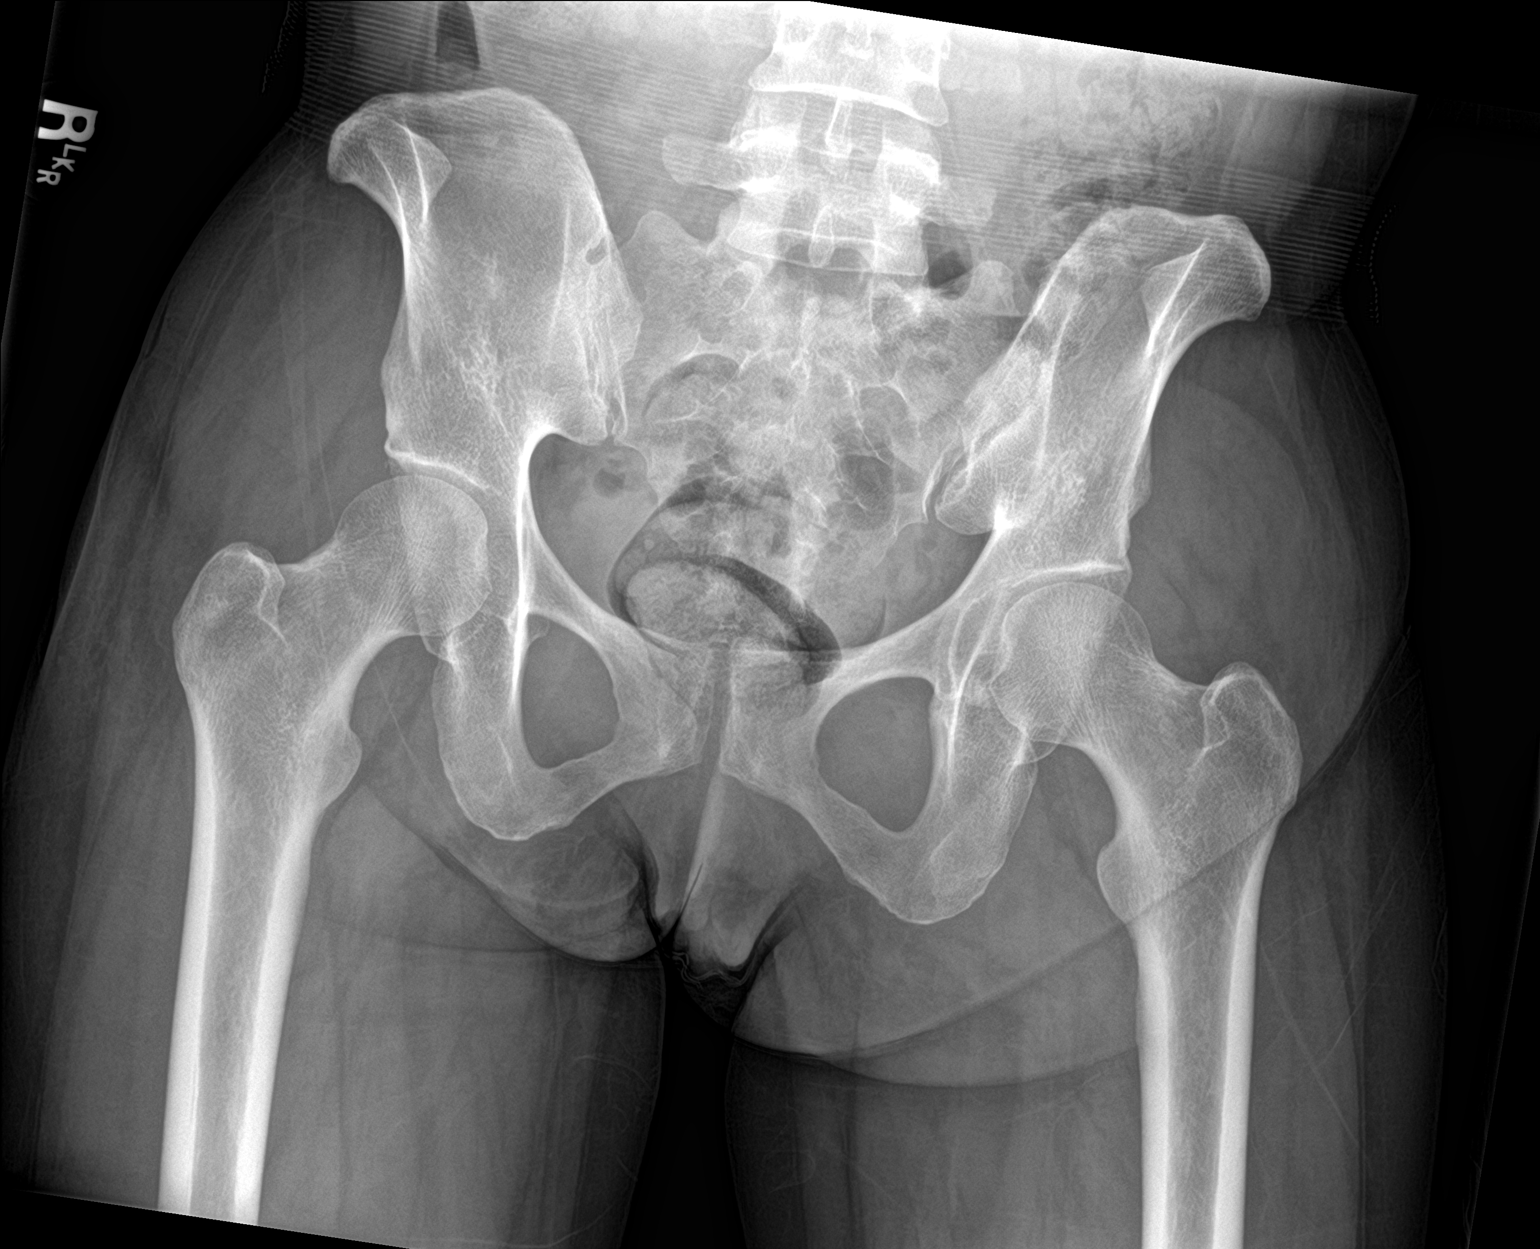
[im 2/3]
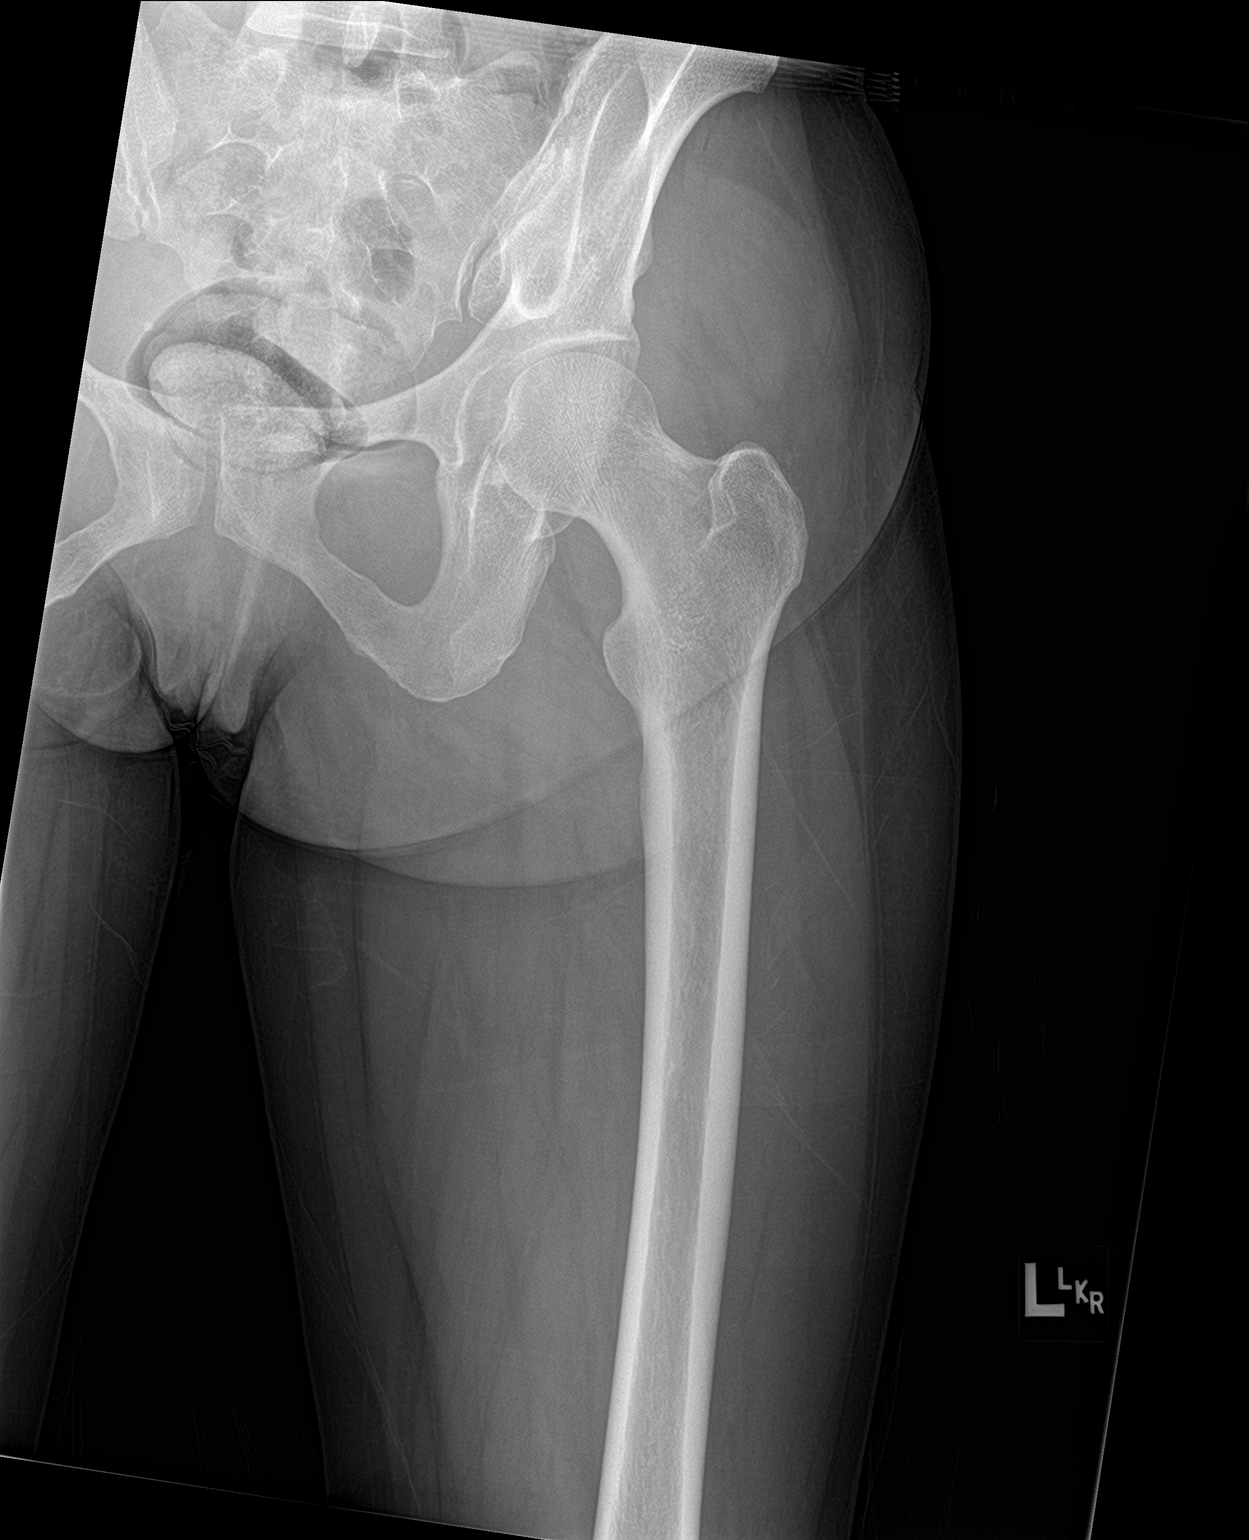
[im 3/3]
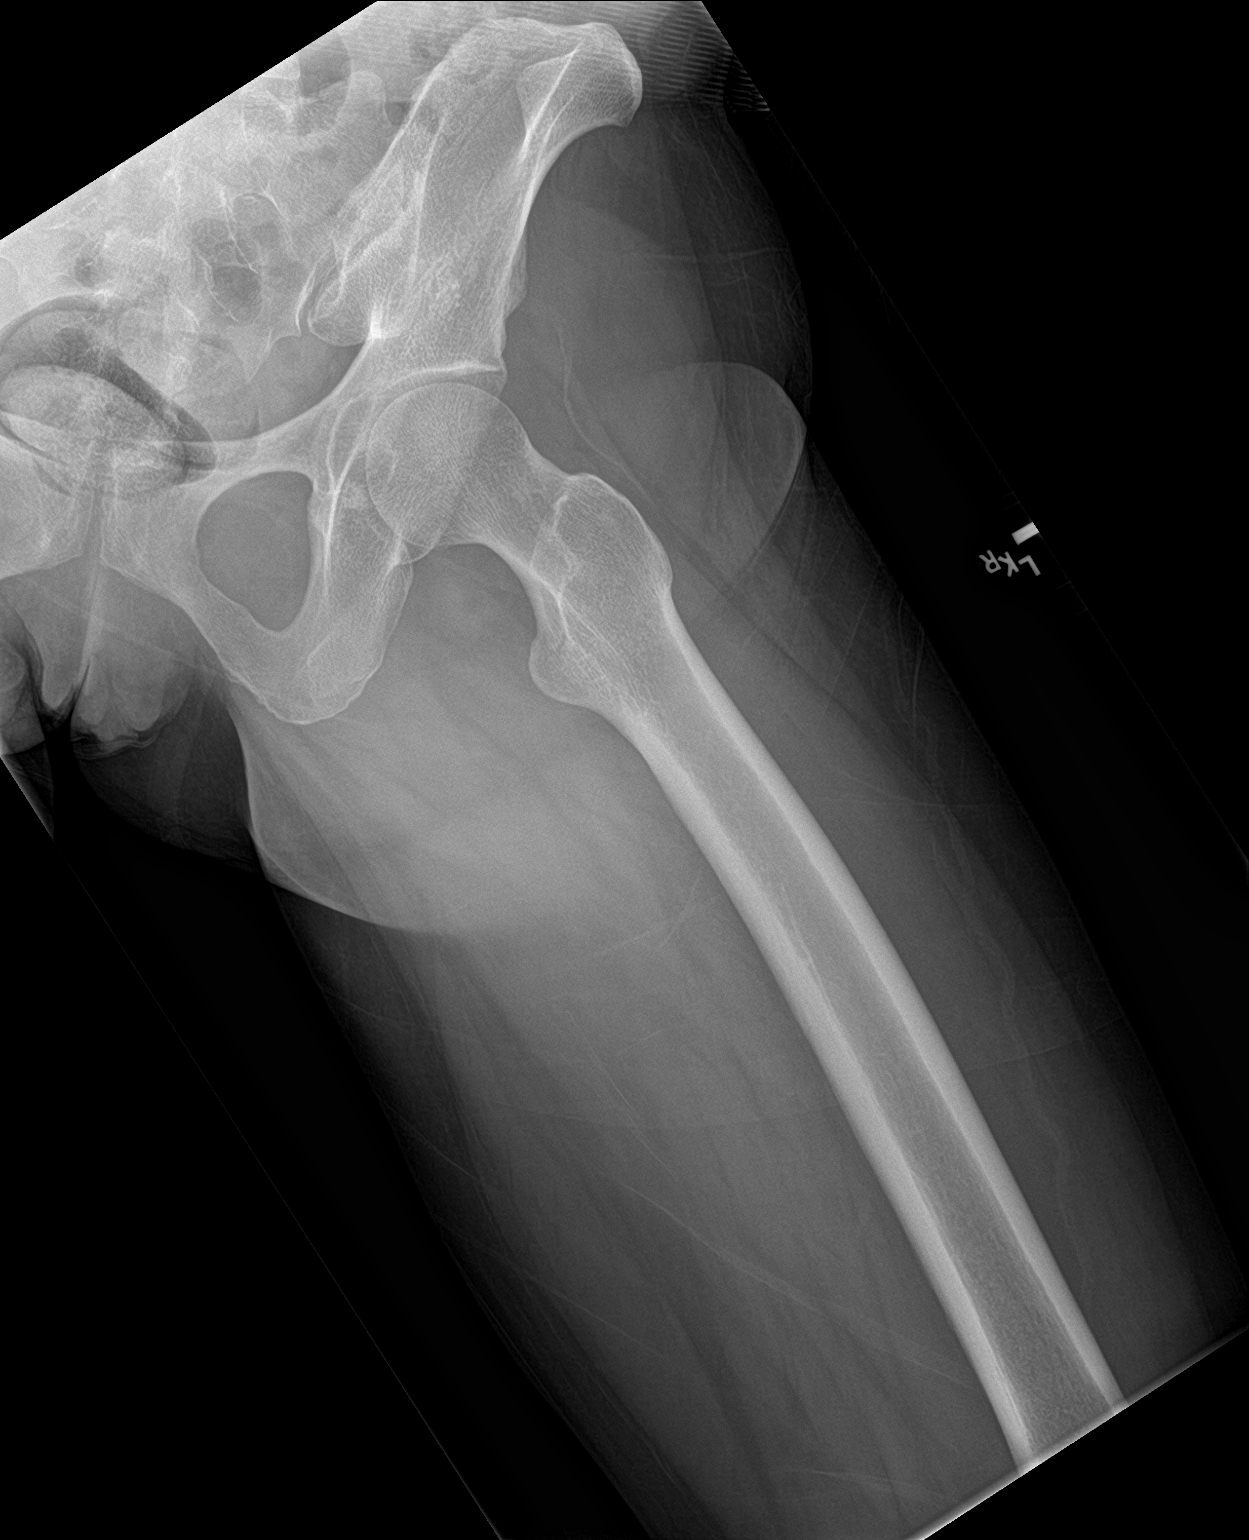

[3 of 3 positions shown; findings below may reference images not displayed]

FINDINGS: There is no evidence of hip fracture or dislocation. There is no
evidence of arthropathy or other focal bone abnormality.
IMPRESSION: Negative.

## 2019-01-31 IMAGING — CR DG LUMBAR SPINE 2-3V
1 series · 3 of 3 positions shown · non-contrast
Comparison: None.

CLINICAL DATA: Pt reports that she fell on [REDACTED] and has lower
back pain. Pt states that the pain is worse on the left side and
runs down the leg. No previous injury to the spine.

EXAM:
LUMBAR SPINE - 2-3 VIEW

[Series 1: dg lumbar spine 2-3 views · 0.14mm/px · 3 of 3 slices shown]
[im 1/3]
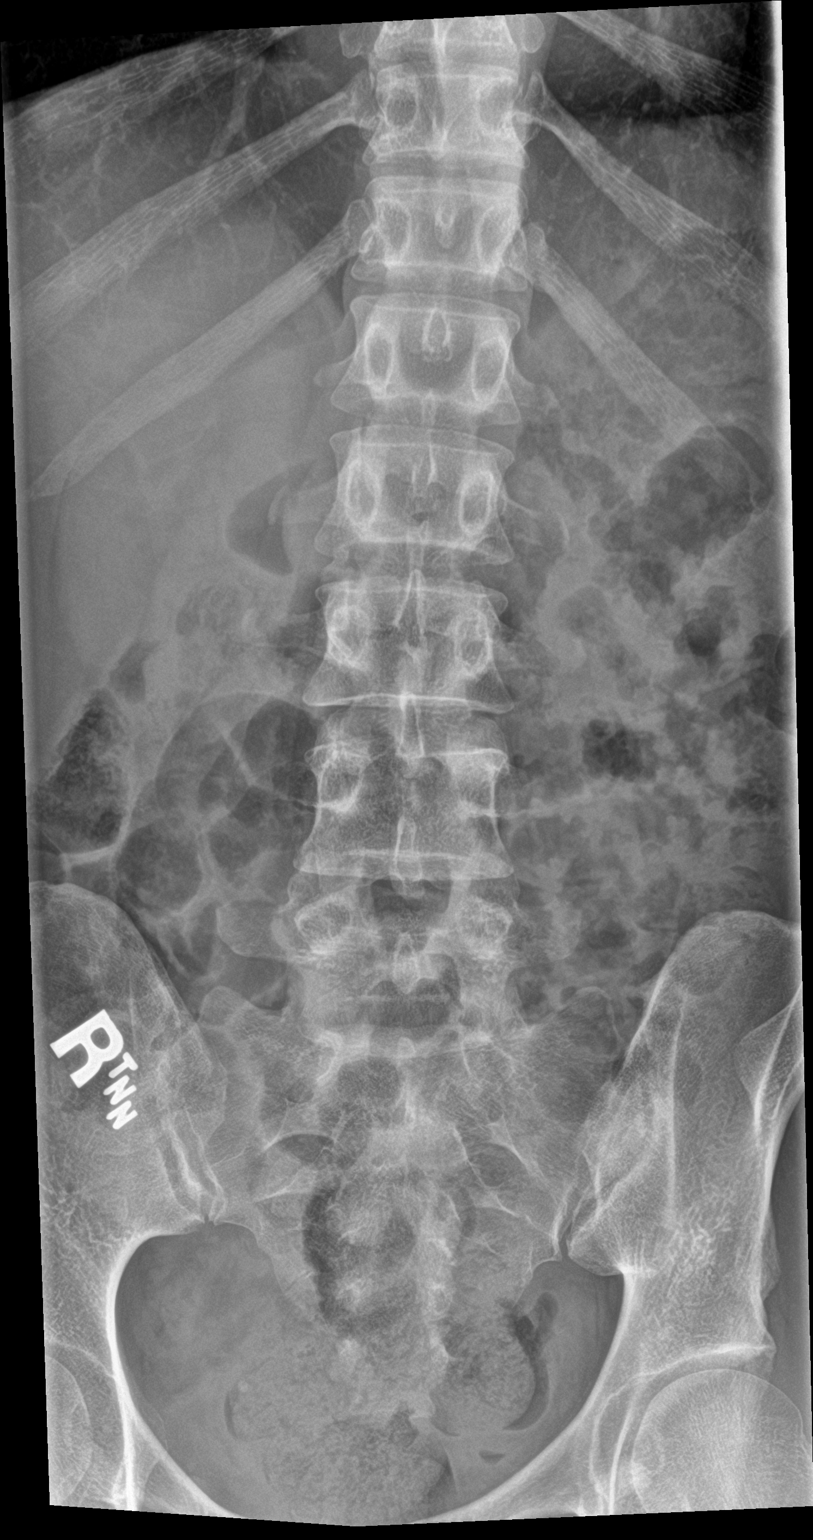
[im 2/3]
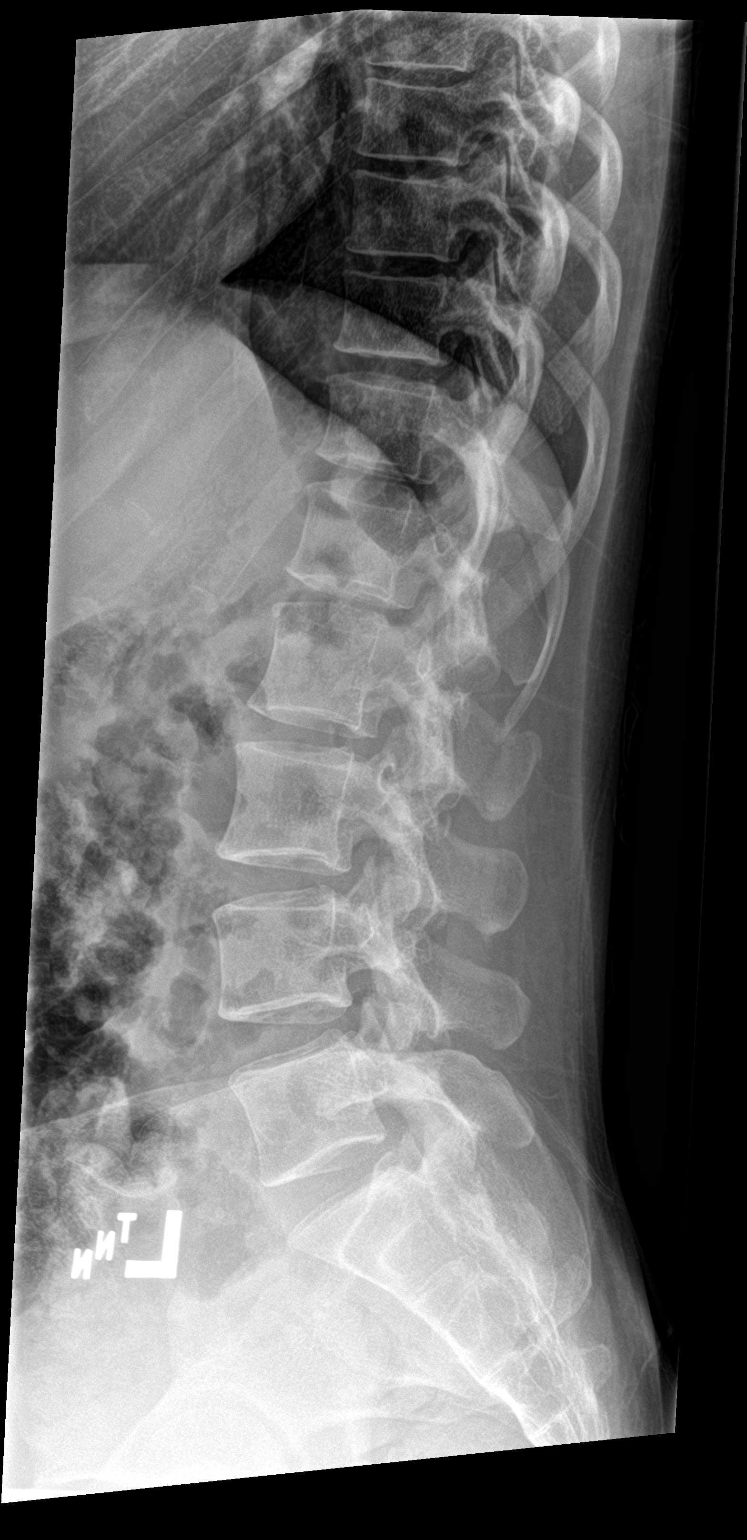
[im 3/3]
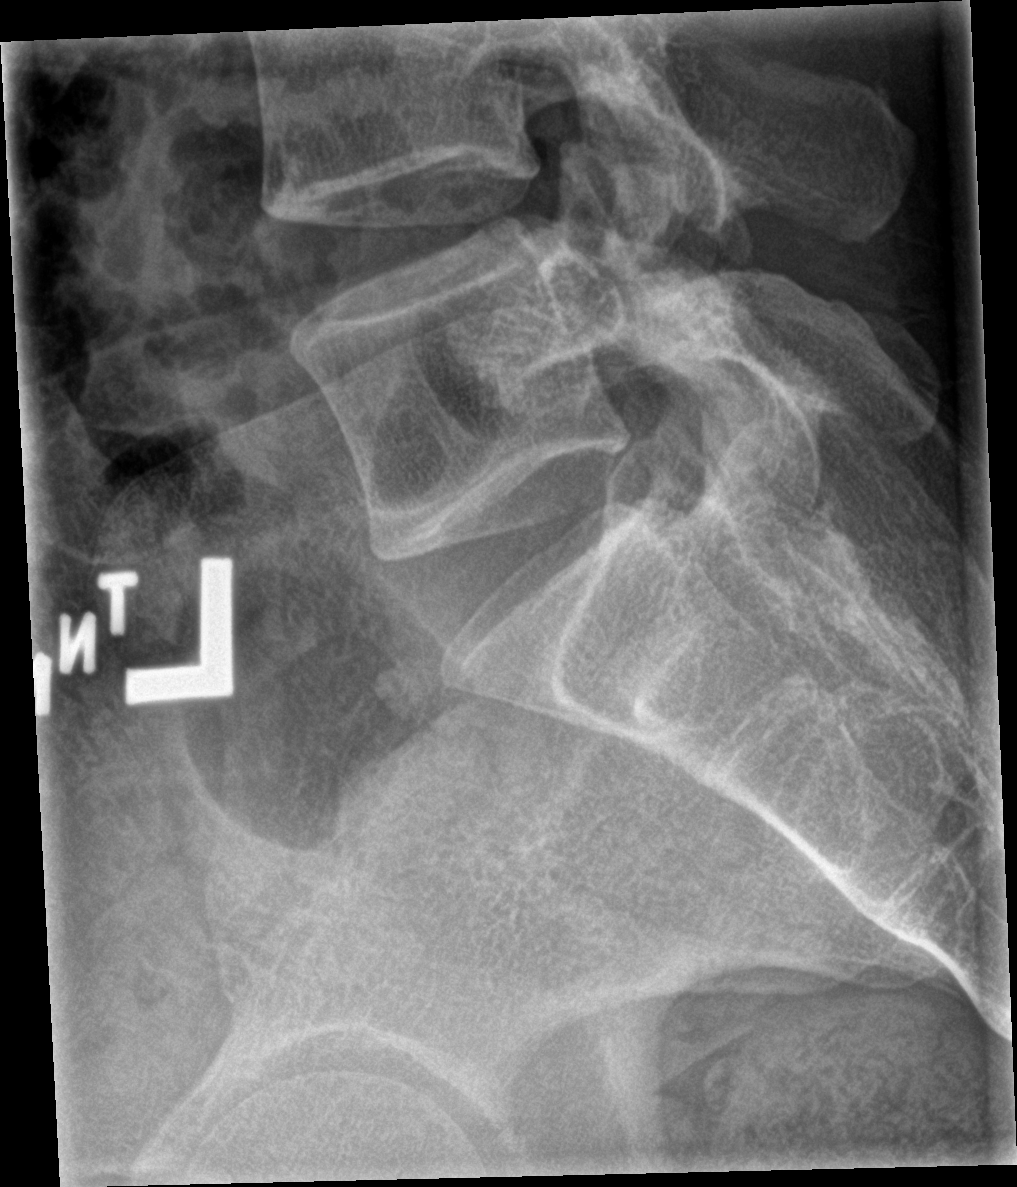

[3 of 3 positions shown; findings below may reference images not displayed]

FINDINGS: There is no evidence of lumbar spine fracture. Alignment is normal.
Intervertebral disc spaces are maintained.
IMPRESSION: Negative.

## 2020-07-12 ENCOUNTER — Ambulatory Visit (LOCAL_COMMUNITY_HEALTH_CENTER): Payer: Medicaid Other

## 2020-07-12 ENCOUNTER — Other Ambulatory Visit: Payer: Self-pay

## 2020-07-12 DIAGNOSIS — Z23 Encounter for immunization: Secondary | ICD-10-CM

## 2020-07-12 NOTE — Progress Notes (Signed)
Presents for Tdap following a "dog bite on back of left leg" that happened on 07/06/20 or 07/07/20. States was a friend's dog and thinks is up to date on rabies vaccine. Encouraged client to notify Animal Control and to monitor bite area for signs of infection and seek medical care if concerns for infection. Jossie Ng, RN

## 2021-09-08 ENCOUNTER — Emergency Department: Payer: Medicaid Other

## 2021-09-08 ENCOUNTER — Other Ambulatory Visit: Payer: Self-pay

## 2021-09-08 ENCOUNTER — Emergency Department
Admission: EM | Admit: 2021-09-08 | Discharge: 2021-09-08 | Disposition: A | Discharge status: Home / Self Care | Payer: Medicaid Other | Attending: Emergency Medicine | Admitting: Emergency Medicine

## 2021-09-08 DIAGNOSIS — J069 Acute upper respiratory infection, unspecified: Secondary | ICD-10-CM

## 2021-09-08 DIAGNOSIS — R059 Cough, unspecified: Secondary | ICD-10-CM | POA: Diagnosis present

## 2021-09-08 DIAGNOSIS — Z20822 Contact with and (suspected) exposure to covid-19: Secondary | ICD-10-CM | POA: Diagnosis not present

## 2021-09-08 DIAGNOSIS — F17200 Nicotine dependence, unspecified, uncomplicated: Secondary | ICD-10-CM | POA: Insufficient documentation

## 2021-09-08 LAB — RESP PANEL BY RT-PCR (FLU A&B, COVID) ARPGX2
Influenza A by PCR: NEGATIVE
Influenza B by PCR: NEGATIVE
SARS Coronavirus 2 by RT PCR: NEGATIVE

## 2021-09-08 MED ORDER — ACETAMINOPHEN 500 MG PO TABS
1000.0000 mg | ORAL_TABLET | Freq: Once | ORAL | Status: AC
  Administered 2021-09-08: 1000 mg via ORAL
  Filled 2021-09-08: qty 2

## 2021-09-08 NOTE — ED Triage Notes (Signed)
Pt presents to triage with c/o generalized malaise, cough, congestion x 4 days.

## 2021-09-08 NOTE — Discharge Instructions (Signed)
Your COVID and influenza tests are negative.  Your chest x-ray does not show any evidence of pneumonia.  You likely have a viral upper respiratory tract infection.  You can take Tylenol and Motrin for pain as well as over-the-counter decongestions for your nasal discharge.  If you develop shortness of breath fevers or unable to eat or drink, please return to the emergency department.

## 2021-09-08 NOTE — ED Notes (Signed)
See triage note  presents with low grade fever ,cough and congestion  sx's started couple of days ago

## 2021-09-08 NOTE — ED Provider Notes (Signed)
[facility]  ____________________________________________   Event Date/Time   First MD Initiated Contact with Patient 09/08/21 [phone]     (approximate)  I have reviewed the triage vital signs and the nursing notes.   HISTORY  Chief Complaint URI    HPI Emily Morrow is a 45 y.o. female with no significant past medical history other than tobacco use who presents with cough and congestion.  Symptoms been going on for about 4 days.  She endorses significant stuffy nose as well as a cough productive of green sputum.  Does think her breathing has been somewhat affected but denies frank shortness of breath.  She has some pain in her chest only with coughing.  No exertional symptoms.  Patient denies abdominal pain, nausea vomiting or diarrhea.  She is tolerating p.o.  Her husband is also sick with similar symptoms and is here today.  She denies fevers or chills.The patient denies hx of prior DVT/PE, unilateral leg pain/swelling, hormone use, recent surgery, hx of cancer, prolonged immobilization, or hemoptysis.           No past medical history on file.  There are no problems to display for this patient.   No past surgical history on file.  Prior to Admission medications   Not on File    Allergies Patient has no known allergies.  No family history on file.  Social History Social History   Tobacco Use   Smoking status: Every Day  Substance Use Topics   Alcohol use: No    Review of Systems   Review of Systems  Constitutional:  Negative for activity change, chills and fever.  HENT:  Positive for congestion and rhinorrhea.   Respiratory:  Positive for cough. Negative for shortness of breath.   Cardiovascular:  Positive for chest pain.  Gastrointestinal:  Negative for abdominal pain, nausea and vomiting.  Neurological:  Negative for headaches.  All other systems reviewed and are negative.  Physical Exam Updated Vital Signs BP 107/78   Pulse  81   Temp 100 F (37.8 C) (Oral)   Resp 16   Ht 5' (1.524 m)   Wt 45 kg   LMP 08/27/2021   SpO2 95%   BMI 19.38 kg/m   Physical Exam Vitals and nursing note reviewed.  Constitutional:      General: She is not in acute distress.    Appearance: Normal appearance.  HENT:     Head: Normocephalic and atraumatic.  Eyes:     General: No scleral icterus.    Conjunctiva/sclera: Conjunctivae normal.  Cardiovascular:     Rate and Rhythm: Normal rate and regular rhythm.  Pulmonary:     Effort: Pulmonary effort is normal. No respiratory distress.     Breath sounds: No stridor. No wheezing or rales.  Abdominal:     General: Abdomen is flat. There is no distension.     Palpations: Abdomen is soft.     Tenderness: There is no abdominal tenderness. There is no guarding.  Musculoskeletal:        General: No deformity or signs of injury.     Cervical back: Normal range of motion.     Right lower leg: No edema.     Left lower leg: No edema.  Skin:    General: Skin is dry.     Coloration: Skin is not jaundiced or pale.  Neurological:     General: No focal deficit present.     Mental Status: She is alert  and oriented to person, place, and time. Mental status is at baseline.  Psychiatric:        Mood and Affect: Mood normal.        Behavior: Behavior normal.     LABS (all labs ordered are listed, but only abnormal results are displayed)  Labs Reviewed  RESP PANEL BY RT-PCR (FLU A&B, COVID) [id number]   ____________________________________________  EKG  NSR, nml axis, nml intervals, no acute ischemic changes  ____________________________________________  RADIOLOGY Ky Barban, personally viewed and evaluated these images (plain radiographs) as part of my medical decision making, as well as reviewing the written report by the radiologist.  ED MD interpretation: I reviewed the chest x-ray which is negative for acute cardiopulmonary  process.    ____________________________________________   PROCEDURES  Procedure(s) performed (including Critical Care):  Procedures   ____________________________________________   INITIAL IMPRESSION / ASSESSMENT AND PLAN / ED COURSE     45 year old female presents with cough and nasal congestion x4 days.  Cough is productive but she has no fevers and no significant shortness of breath.  Pain in her chest is only with coughing.  Vital signs are notable for low-grade temp of 100, satting 96% on room air.  She appears well and is tolerating p.o. on my exam.  Lungs are clear, no respiratory distress and abdomen is benign.  Differential diagnosis primarily includes pneumonia versus bronchitis versus URI.  Her COVID and influenza testing are negative.  We will get a chest x-ray to rule out pneumonia, as well as an EKG.  However her pain in her chest is only with coughing.  Very low suspicion for ACS.  EKG is nonischemic.  Chest x-ray does not show any infiltrate by my read.  Discharged with supportive care.      ____________________________________________   FINAL CLINICAL IMPRESSION(S) / ED DIAGNOSES  Final diagnoses:  Viral URI with cough     ED Discharge Orders     None        Note:  This document was prepared using Dragon voice recognition software and may include unintentional dictation errors.    Georga Hacking, MD 09/08/21 [phone]

## 2022-05-27 ENCOUNTER — Ambulatory Visit (LOCAL_COMMUNITY_HEALTH_CENTER): Payer: Medicaid Other

## 2022-05-27 VITALS — BP 112/73 | Ht 60.0 in | Wt 90.0 lb

## 2022-05-27 DIAGNOSIS — Z3202 Encounter for pregnancy test, result negative: Secondary | ICD-10-CM | POA: Diagnosis not present

## 2022-05-27 NOTE — Progress Notes (Signed)
UPT negative.  States home PT yesterday and saw 2 lines.  States had menses 03/08/22 but not normal.  Using no BCM but says she signed papers and thinks she had tubes tied 2013.    Instructed pt to followup with PCP; resource list given.  Reports she called [facility] who she has seen in the past but not taking new pts.    Cherlynn Polo, RN

## 2022-05-28 LAB — PREGNANCY, URINE: Preg Test, Ur: NEGATIVE
